# Patient Record
Sex: Female | Born: 1985 | Race: Black or African American | Hispanic: No | Marital: Single | Smoking: Never smoker
Health system: Southern US, Community
[De-identification: ages and names within clinical notes are randomized; demographics above are authoritative.]

## PROBLEM LIST (undated history)

## (undated) ENCOUNTER — Inpatient Hospital Stay (HOSPITAL_COMMUNITY): Payer: Self-pay

## (undated) DIAGNOSIS — Z8619 Personal history of other infectious and parasitic diseases: Secondary | ICD-10-CM

## (undated) DIAGNOSIS — D573 Sickle-cell trait: Secondary | ICD-10-CM

## (undated) HISTORY — DX: Sickle-cell trait: D57.3

## (undated) HISTORY — PX: THERAPEUTIC ABORTION: SHX798

## (undated) HISTORY — DX: Personal history of other infectious and parasitic diseases: Z86.19

## (undated) HISTORY — PX: DILATION AND CURETTAGE OF UTERUS: SHX78

## (undated) HISTORY — PX: WISDOM TOOTH EXTRACTION: SHX21

---

## 2012-01-01 ENCOUNTER — Inpatient Hospital Stay (HOSPITAL_COMMUNITY)
Admission: AD | Admit: 2012-01-01 | Discharge: 2012-01-01 | Disposition: A | Discharge status: Home / Self Care | Payer: 59 | Source: Ambulatory Visit | Attending: Obstetrics and Gynecology | Admitting: Obstetrics and Gynecology

## 2012-01-01 ENCOUNTER — Encounter (HOSPITAL_COMMUNITY): Payer: Self-pay | Admitting: *Deleted

## 2012-01-01 DIAGNOSIS — Z3201 Encounter for pregnancy test, result positive: Secondary | ICD-10-CM | POA: Insufficient documentation

## 2012-01-01 LAB — POCT PREGNANCY, URINE: Preg Test, Ur: POSITIVE — AB

## 2012-01-01 LAB — URINALYSIS, ROUTINE W REFLEX MICROSCOPIC
Leukocytes, UA: NEGATIVE
Nitrite: NEGATIVE
Specific Gravity, Urine: 1.01 (ref 1.005–1.030)
Urobilinogen, UA: 0.2 mg/dL (ref 0.0–1.0)

## 2012-01-01 LAB — URINE MICROSCOPIC-ADD ON

## 2012-01-01 NOTE — MAU Note (Signed)
PT  SAYS HAD NL IN FEB.  NO  CYCLE IN [facility]- April.    HOME PREG  LAST WEEK - POSTIVE.  SAYS  VOMITING STARTED LAST WEEK.   TODAY- VOMITED  X3.Marland Kitchen  FEELS ABD CRAMPS LIKE CYCLE - BUT NO PAIN.

## 2012-01-01 NOTE — MAU Provider Note (Signed)
History     CSN: [id number]  Arrival date & time 01/01/12  2149   None     No chief complaint on file.  HPI Maria Fuentes is a 26 y.o. female @ [redacted]w[redacted]d gestation who presents to MAU for pregnancy verification letter. Patient denies pain, bleeding or other problems.  Past Medical History  Diagnosis Date  . No pertinent past medical history     Past Surgical History  Procedure Date  . Dilation and curettage of uterus   . Wisdom tooth extraction     Family History  Problem Relation Age of Onset  . Diabetes Maternal Grandmother   . Hypertension Maternal Grandmother     History  Substance Use Topics  . Smoking status: Never Smoker   . Smokeless tobacco: Not on file  . Alcohol Use: No    OB History    Grav Para Term Preterm Abortions TAB SAB Ect Mult Living   2    1 1     0      Review of Systems  Gastrointestinal: Negative for nausea, vomiting and abdominal pain.  Genitourinary: Negative for dysuria, frequency, vaginal bleeding, vaginal discharge and pelvic pain.    Allergies  Review of patient's allergies indicates not on file.  Home Medications  No current outpatient prescriptions on file.  BP 112/68  Pulse 100  Temp(Src) 100.5 F (38.1 C) (Oral)  Resp 20  Ht 5\' 3"  (1.6 m)  Wt 230 lb (104.327 kg)  BMI 40.74 kg/m2  LMP 10/26/2011  Physical Exam  Constitutional: She is oriented to person, place, and time. She appears well-developed and well-nourished. No distress.  HENT:  Head: Normocephalic.  Eyes: EOM are normal.  Neck: Neck supple.  Cardiovascular: Normal rate.   Pulmonary/Chest: Effort normal.  Musculoskeletal: Normal range of motion.  Neurological: She is alert and oriented to person, place, and time.  Psychiatric: Her behavior is normal. Thought content normal.   Assessment: Positive pregnancy test  Plan:  Pregnancy verification letter   Instructions on health department and what she needs to take for insurance   Return as needed for  problems. ED Course  Procedures  MDM

## 2012-01-01 NOTE — Discharge Instructions (Signed)
  ________________________________________     To schedule your Maternity Eligibility Appointment, please call [phone].  When you arrive for your appointment you must bring the following items or information listed below.  Your appointment will be rescheduled if you do not have these items or are 15 minutes late. If currently receiving Medicaid, you MUST bring: 1. Medicaid Card 2. Social Security Card 3. Picture ID 4. Proof of Pregnancy 5. Verification of current address if the address on Medicaid card is incorrect "postmarked mail" If not receiving Medicaid, you MUST bring: 1. Social Security Card 2. Picture ID 3. Birth Certificate (if available) Passport or *Green Card 4. Proof of Pregnancy 5. Verification of current address "postmarked mail" for each income presented. 6. Verification of insurance coverage, if any 7. Check stubs from each employer for the previous month (if unable to present check stub  for each week, we will accept check stub for the first and last week ill the same month.) If you can't locate check stubs, you must bring a letter from the employer(s) and it must have the following information on letterhead, typed, in English: o name of company o company telephone number o how long been with the company, if less than one month o how much person earns per hour o how many hours per week work o the gross pay the person earned for the previous month If you are 28 years old or less, you do not have to bring proof of income unless you work or live with the father of the baby and at that time we will need proof of income from you and/or the father of the baby. Green Card recipients are eligible for Medicaid for Pregnant Women (MPW)    Prenatal Care  WHAT IS PRENATAL CARE?  Prenatal care means health care during your pregnancy, before your baby is born. Take care of yourself and your baby by:   Getting early prenatal care. If you know you are pregnant, or think  you might be pregnant, call your caregiver as soon as possible. Schedule a visit for a general/prenatal examination.   Getting regular prenatal care. Follow your caregiver's schedule for blood and other necessary tests. Do not miss appointments.   Do everything you can to keep yourself and your baby healthy during your pregnancy.   Prenatal care should include evaluation of medical, dietary, educational, psychological, and social needs for the couple and the medical, surgical, and genetic history of the family of the mother and father.   Discuss with your caregiver:   Your medicines, prescription, over-the-counter, and herbal medicines.   Substance abuse, alcohol, smoking, and illegal drugs.   Domestic abuse and violence, if present.   Your immunizations.   Nutrition and diet.   Exercising.   Environment and occupational hazards, at home and at work.   History of sexually transmitted disease, both you and your partner.   Previous pregnancies.  WHY IS PRENATAL CARE SO IMPORTANT?  By seeing you regularly, your caregiver has the chance to find problems early, so that they can be treated as soon as possible. Other problems might be prevented. Many studies have shown that early and regular prenatal care is important for the health of both mothers and their babies.  I AM THINKING ABOUT GETTING PREGNANT. HOW CAN I TAKE CARE OF MYSELF?  Taking care of yourself before you get pregnant helps you to have a healthy pregnancy. It also lowers your chances of having a baby born with a birth   defect. Here are ways to take care of yourself before you get pregnant:   Eat healthy foods, exercise regularly (30 minutes per day for most days of the week is best), and get enough rest and sleep. Talk to your caregiver about what kinds of foods and exercises are best for you.   Take 400 micrograms (mcg) of folic acid (one of the B vitamins) every day. The best way to do this is to take a daily multivitamin  pill that contains this amount of folic acid. Getting enough of the synthetic (manufactured) form of folic acid every day before you get pregnant and during early pregnancy can help prevent certain birth defects. Many breakfast cereals and other grain products have folic acid added to them, but only certain cereals contain 400 mcg of folic acid per serving. Check the label on your multivitamin or cereal to find the amount of folic acid in the food.   See your caregiver for a complete check up before getting pregnant. Make sure that you have had all your immunization shots, especially for rubella (German measles). Rubella can cause serious birth defects. Chickenpox is another illness you want to avoid during pregnancy. If you have had chickenpox and rubella in the past, you should be immune to them.   Tell your caregiver about any prescription or non-prescription medicines (including herbal remedies) you are taking. Some medicines are not safe to take during pregnancy.   Stop smoking cigarettes, drinking alcohol, or taking illegal drugs. Ask your caregiver for help, if you need it. You can also get help with alcohol and drugs by talking with a member of your faith community, a counselor, or a trusted friend.   Discuss and treat any medical, social, or psychological problems before getting pregnant.   Discuss any history of genetic problems in the mother, father, and their families. Do genetic testing before getting pregnant, when possible.   Discuss any physical or emotional abuse with your caregiver.   Discuss with your caregiver if you might be exposed to harmful chemicals on your job or where you live.   Discuss with your caregiver if you think your job or the hours you work may be harmful and should be changed.   The father should be involved with the decision making and with all aspects of the pregnancy, labor, and delivery.   If you have medical insurance, make sure you are covered for  pregnancy.  I JUST FOUND OUT THAT I AM PREGNANT. HOW CAN I TAKE CARE OF MYSELF?  Here are ways to take care of yourself and the precious new life growing inside you:   Continue taking your multivitamin with 400 micrograms (mcg) of folic acid every day.   Get early and regular prenatal care. It does not matter if this is your first pregnancy or if you already have children. It is very important to see a caregiver during your pregnancy. Your caregiver will check at each visit to make sure that you and the baby are healthy. If there are any problems, action can be taken right away to help you and the baby.   Eat a healthy diet that includes:   Fruits.   Vegetables.   Foods low in saturated fat.   Grains.   Calcium-rich foods.   Drink 6 to 8 glasses of liquids a day.   Unless your caregiver tells you not to, try to be physically active for 30 minutes, most days of the week. If you are pressed for   time, you can get your activity in through 10 minute segments, three times a day.   If you smoke, drink alcohol, or use drugs, STOP. These can cause long-term damage to your baby. Talk with your caregiver about steps to take to stop smoking. Talk with a member of your faith community, a counselor, a trusted friend, or your caregiver if you are concerned about your alcohol or drug use.   Ask your caregiver before taking any medicine, even over-the-counter medicines. Some medicines are not safe to take during pregnancy.   Get plenty of rest and sleep.   Avoid hot tubs and saunas during pregnancy.   Do not have X-rays taken, unless absolutely necessary and with the recommendation of your caregiver. A lead shield can be placed on your abdomen, to protect the baby when X-rays are taken in other parts of the body.   Do not empty the cat litter when you are pregnant. It may contain a parasite that causes an infection called toxoplasmosis, which can cause birth defects. Also, use gloves when working in  garden areas used by cats.   Do not eat uncooked or undercooked cheese, meats, or fish.   Stay away from toxic chemicals like:   Insecticides.   Solvents (some cleaners or paint thinners).   Lead.   Mercury.   Sexual relations may continue until the end of the pregnancy, unless you have a medical problem or there is a problem with the pregnancy and your caregiver tells you not to.   Do not wear high heel shoes, especially during the second half of the pregnancy. You can lose your balance and fall.   Do not take long trips, unless absolutely necessary. Be sure to see your caregiver before going on the trip.   Do not sit in one position for more than 2 hours, when on a trip.   Take a copy of your medical records when going on a trip.   Know where there is a hospital in the city you are visiting, in case of an emergency.   Most dangerous household products will have pregnancy warnings on their labels. Ask your caregiver about products if you are unsure.   Limit or eliminate your caffeine intake from coffee, tea, sodas, medicines, and chocolate.   Many women continue working through pregnancy. Staying active might help you stay healthier. If you have a question about the safety or the hours you work at your particular job, talk with your caregiver.   Get informed:   Read books.   Watch videos.   Go to childbirth classes for you and the father.   Talk with experienced moms.   Ask your caregiver about childbirth education classes for you and your partner. Classes can help you and your partner prepare for the birth of your baby.   Ask about a pediatrician (baby doctor) and methods and pain medicine for labor, delivery, and possible Cesarean delivery (C-section).  I AM NOT THINKING ABOUT GETTING PREGNANT RIGHT NOW, BUT HEARD THAT ALL WOMEN SHOULD TAKE FOLIC ACID EVERY DAY?  All women of childbearing age, with even a remote chance of getting pregnant, should try to make sure they  get enough folic acid. Many pregnancies are not planned. Many women do not know they are actually pregnant early in their pregnancies, and certain birth defects happen in the very early part of pregnancy. Taking 400 micrograms (mcg) of folic acid every day will help prevent certain birth defects that happen in the early   part of pregnancy. If a woman begins taking vitamin pills in the second or third month of pregnancy, it may be too late to prevent birth defects. Folic acid may also have other health benefits for women, besides preventing birth defects.  HOW OFTEN SHOULD I SEE MY CAREGIVER DURING PREGNANCY?  Your caregiver will give you a schedule for your prenatal visits. You will have visits more often as you get closer to the end of your pregnancy. An average pregnancy lasts about 40 weeks.  A typical schedule includes visiting your caregiver:   About once each month, during your first 6 months of pregnancy.   Every 2 weeks, during the next 2 months.   Weekly in the last month, until the delivery date.  Your caregiver will probably want to see you more often if:  You are over 35.   Your pregnancy is high risk, because you have certain health problems or problems with the pregnancy, such as:   Diabetes.   High blood pressure.   The baby is not growing on schedule, according to the dates of the pregnancy.  Your caregiver will do special tests, to make sure you and the baby are not having any serious problems. WHAT HAPPENS DURING PRENATAL VISITS?   At your first prenatal visit, your caregiver will talk to you about you and your partner's health history and your family's health history, and will do a physical exam.   On your first visit, a physical exam will include checks of your blood pressure, height and weight, and an exam of your pelvic organs. Your caregiver will do a Pap test if you have not had one recently, and will do cultures of your cervix to make sure there is no infection.    At each visit, there will be tests of your blood, urine, blood pressure, weight, and checking the progress of the baby.   Your caregiver will be able to tell you when to expect that your baby will be born.   Each visit is also a chance for you to learn about staying healthy during pregnancy and for asking questions.   Discuss whether you will be breastfeeding.   At your later prenatal visits, your caregiver will check how you are doing and how the baby is developing. You may have a number of tests done as your pregnancy progresses.   Ultrasound exams are often used to check on the baby's growth and health.   You may have more urine and blood tests, as well as special tests, if needed. These may include amniocentesis (examine fluid in the pregnancy sac), stress tests (check how baby responds to contractions), biophysical profile (measures fetus well-being). Your caregiver will explain the tests and why they are necessary.  I AM IN MY LATE THIRTIES, AND I WANT TO HAVE A CHILD NOW. SHOULD I DO ANYTHING SPECIAL?  As you get older, there is more chance of having a medical problem (high blood pressure), pregnancy problem (preeclampsia, problems with the placenta), miscarriage, or a baby born with a birth defect. However, most women in their late thirties and early forties have healthy babies. See your caregiver on a regular basis before you get pregnant and be sure to go for exams throughout your pregnancy. Your caregiver probably will want to do some special tests to check on you and your baby's health when you are pregnant.  Women today are often delaying having children until later in life, when they are in their thirties and forties. While   many women in their thirties and forties have no difficulty getting pregnant, fertility does decline with age. For women over 40 who cannot get pregnant after 6 months of trying, it is recommended that they see their caregiver for a fertility evaluation. It is not  uncommon to have trouble becoming pregnant or experience infertility (inability to become pregnant after trying for one year). If you think that you or your partner may be infertile, you can discuss this with your caregiver. He or she can recommend treatments such as drugs, surgery, or assisted reproductive technology.  Document Released: 08/31/2003 Document Revised: 08/17/2011 Document Reviewed: 07/28/2009 [employer] Patient Information 2012 [employer]. 

## 2012-01-02 NOTE — MAU Provider Note (Signed)
Agree with above note.  Maria Fuentes 01/02/2012 6:51 AM

## 2012-04-30 LAB — OB RESULTS CONSOLE HEPATITIS B SURFACE ANTIGEN: Hepatitis B Surface Ag: NEGATIVE

## 2012-04-30 LAB — OB RESULTS CONSOLE ABO/RH: RH Type: POSITIVE

## 2012-04-30 LAB — OB RESULTS CONSOLE HIV ANTIBODY (ROUTINE TESTING): HIV: NONREACTIVE

## 2012-04-30 LAB — OB RESULTS CONSOLE RPR: RPR: NONREACTIVE

## 2012-08-13 ENCOUNTER — Telehealth (HOSPITAL_COMMUNITY): Payer: Self-pay | Admitting: *Deleted

## 2012-08-13 ENCOUNTER — Inpatient Hospital Stay (HOSPITAL_COMMUNITY): Payer: Self-pay | Admitting: *Deleted

## 2012-08-13 NOTE — Telephone Encounter (Signed)
Preadmission screen  

## 2012-08-15 ENCOUNTER — Inpatient Hospital Stay (HOSPITAL_COMMUNITY)
Admission: RE | Admit: 2012-08-15 | Discharge: 2012-08-19 | DRG: 766 | Disposition: A | Discharge status: Home / Self Care | Payer: 59 | Source: Ambulatory Visit | Attending: Obstetrics and Gynecology | Admitting: Obstetrics and Gynecology

## 2012-08-15 ENCOUNTER — Encounter (HOSPITAL_COMMUNITY): Payer: Self-pay

## 2012-08-15 ENCOUNTER — Inpatient Hospital Stay (HOSPITAL_COMMUNITY)
Admission: AD | Admit: 2012-08-15 | Discharge: 2012-08-15 | Disposition: A | Discharge status: Home / Self Care | Payer: Medicaid Other | Source: Ambulatory Visit | Attending: Obstetrics and Gynecology | Admitting: Obstetrics and Gynecology

## 2012-08-15 DIAGNOSIS — O480 Post-term pregnancy: Principal | ICD-10-CM | POA: Diagnosis present

## 2012-08-15 DIAGNOSIS — Z22330 Carrier of Group B streptococcus: Secondary | ICD-10-CM

## 2012-08-15 DIAGNOSIS — D573 Sickle-cell trait: Secondary | ICD-10-CM | POA: Diagnosis present

## 2012-08-15 DIAGNOSIS — O3660X0 Maternal care for excessive fetal growth, unspecified trimester, not applicable or unspecified: Secondary | ICD-10-CM | POA: Diagnosis present

## 2012-08-15 DIAGNOSIS — O99892 Other specified diseases and conditions complicating childbirth: Secondary | ICD-10-CM | POA: Diagnosis present

## 2012-08-15 DIAGNOSIS — O9902 Anemia complicating childbirth: Secondary | ICD-10-CM | POA: Diagnosis present

## 2012-08-15 DIAGNOSIS — O479 False labor, unspecified: Secondary | ICD-10-CM | POA: Insufficient documentation

## 2012-08-15 DIAGNOSIS — O471 False labor at or after 37 completed weeks of gestation: Secondary | ICD-10-CM

## 2012-08-15 LAB — CBC
Hemoglobin: 12.1 g/dL (ref 12.0–15.0)
RBC: 4.3 MIL/uL (ref 3.87–5.11)

## 2012-08-15 LAB — TYPE AND SCREEN: ABO/RH(D): A POS

## 2012-08-15 MED ORDER — EPHEDRINE 5 MG/ML INJ: 10.0000 mg | INTRAVENOUS | Status: DC | PRN

## 2012-08-15 MED ORDER — PHENYLEPHRINE 40 MCG/ML (10ML) SYRINGE FOR IV PUSH (FOR BLOOD PRESSURE SUPPORT): 80.0000 ug | PREFILLED_SYRINGE | INTRAVENOUS | Status: DC | PRN

## 2012-08-15 MED ORDER — LACTATED RINGERS IV SOLN
INTRAVENOUS | Status: DC
  Administered 2012-08-15 – 2012-08-16 (×3): via INTRAVENOUS

## 2012-08-15 MED ORDER — MORPHINE SULFATE 4 MG/ML IJ SOLN
8.0000 mg | Freq: Once | INTRAMUSCULAR | Status: AC
  Administered 2012-08-15: 8 mg via INTRAMUSCULAR
  Filled 2012-08-15: qty 2

## 2012-08-15 MED ORDER — OXYCODONE-ACETAMINOPHEN 5-325 MG PO TABS: 1.0000 | ORAL_TABLET | ORAL | Status: DC | PRN

## 2012-08-15 MED ORDER — EPHEDRINE 5 MG/ML INJ
10.0000 mg | INTRAVENOUS | Status: DC | PRN
  Filled 2012-08-15: qty 4

## 2012-08-15 MED ORDER — IBUPROFEN 600 MG PO TABS: 600.0000 mg | ORAL_TABLET | Freq: Four times a day (QID) | ORAL | Status: DC | PRN

## 2012-08-15 MED ORDER — NALBUPHINE SYRINGE 5 MG/0.5 ML
5.0000 mg | INJECTION | INTRAMUSCULAR | Status: DC | PRN
  Administered 2012-08-15 – 2012-08-16 (×2): 5 mg via INTRAVENOUS
  Filled 2012-08-15 (×2): qty 0.5

## 2012-08-15 MED ORDER — CITRIC ACID-SODIUM CITRATE 334-500 MG/5ML PO SOLN
30.0000 mL | ORAL | Status: DC | PRN
  Administered 2012-08-16: 30 mL via ORAL
  Filled 2012-08-15: qty 15

## 2012-08-15 MED ORDER — LACTATED RINGERS IV SOLN
500.0000 mL | INTRAVENOUS | Status: DC | PRN
  Administered 2012-08-16 (×2): 1000 mL via INTRAVENOUS

## 2012-08-15 MED ORDER — TERBUTALINE SULFATE 1 MG/ML IJ SOLN: 0.2500 mg | Freq: Once | INTRAMUSCULAR | Status: AC | PRN

## 2012-08-15 MED ORDER — PENICILLIN G POTASSIUM 5000000 UNITS IJ SOLR
5.0000 10*6.[IU] | Freq: Once | INTRAVENOUS | Status: AC
  Administered 2012-08-15: 5 10*6.[IU] via INTRAVENOUS
  Filled 2012-08-15: qty 5

## 2012-08-15 MED ORDER — FLEET ENEMA 7-19 GM/118ML RE ENEM: 1.0000 | ENEMA | RECTAL | Status: DC | PRN

## 2012-08-15 MED ORDER — PHENYLEPHRINE 40 MCG/ML (10ML) SYRINGE FOR IV PUSH (FOR BLOOD PRESSURE SUPPORT)
80.0000 ug | PREFILLED_SYRINGE | INTRAVENOUS | Status: DC | PRN
  Administered 2012-08-16: 80 ug via INTRAVENOUS
  Filled 2012-08-15: qty 5

## 2012-08-15 MED ORDER — OXYTOCIN 40 UNITS IN LACTATED RINGERS INFUSION - SIMPLE MED: 62.5000 mL/h | INTRAVENOUS | Status: DC

## 2012-08-15 MED ORDER — ZOLPIDEM TARTRATE 5 MG PO TABS: 5.0000 mg | ORAL_TABLET | Freq: Every evening | ORAL | Status: DC | PRN

## 2012-08-15 MED ORDER — ACETAMINOPHEN 325 MG PO TABS: 650.0000 mg | ORAL_TABLET | ORAL | Status: DC | PRN

## 2012-08-15 MED ORDER — FENTANYL 2.5 MCG/ML BUPIVACAINE 1/10 % EPIDURAL INFUSION (WH - ANES)
14.0000 mL/h | INTRAMUSCULAR | Status: DC
  Administered 2012-08-16: 14 mL/h via EPIDURAL
  Filled 2012-08-15: qty 125

## 2012-08-15 MED ORDER — OXYTOCIN BOLUS FROM INFUSION: 500.0000 mL | INTRAVENOUS | Status: DC

## 2012-08-15 MED ORDER — OXYTOCIN 40 UNITS IN LACTATED RINGERS INFUSION - SIMPLE MED
1.0000 m[IU]/min | INTRAVENOUS | Status: DC
  Administered 2012-08-15: 2 m[IU]/min via INTRAVENOUS
  Filled 2012-08-15: qty 1000

## 2012-08-15 MED ORDER — ONDANSETRON HCL 4 MG/2ML IJ SOLN: 4.0000 mg | Freq: Four times a day (QID) | INTRAMUSCULAR | Status: DC | PRN

## 2012-08-15 MED ORDER — PENICILLIN G POTASSIUM 5000000 UNITS IJ SOLR
2.5000 10*6.[IU] | INTRAVENOUS | Status: DC
  Administered 2012-08-16 (×4): 2.5 10*6.[IU] via INTRAVENOUS
  Filled 2012-08-15 (×7): qty 2.5

## 2012-08-15 MED ORDER — LIDOCAINE HCL (PF) 1 % IJ SOLN: 30.0000 mL | INTRAMUSCULAR | Status: DC | PRN

## 2012-08-15 MED ORDER — LACTATED RINGERS IV SOLN
500.0000 mL | Freq: Once | INTRAVENOUS | Status: AC
  Administered 2012-08-16: 500 mL via INTRAVENOUS

## 2012-08-15 MED ORDER — DIPHENHYDRAMINE HCL 50 MG/ML IJ SOLN: 12.5000 mg | INTRAMUSCULAR | Status: DC | PRN

## 2012-08-15 NOTE — MAU Note (Signed)
Pt states contractions started at 9pm and have progressively gotten closer together and stronger

## 2012-08-15 NOTE — MAU Provider Note (Signed)
Pt was reviewed and I agree with management plan

## 2012-08-15 NOTE — MAU Provider Note (Signed)
  History     CSN: [id number]  Arrival date and time: 08/15/12 7829   None     Chief Complaint  Patient presents with  . Labor Eval   HPI Maria Fuentes is 26yo G1P0 with GA [redacted]wk who presents with contractions. Patient reports contractions since 9pm yesterday and it is getting worse. She denies vaginal discharge, rupture of membranes or vaginal bleeding.  Good fetal movement. Pt has an induction schedule for tomorrow morning.  Past Medical History  Diagnosis Date  . No pertinent past medical history   . H/O varicella   . Sickle cell trait     Past Surgical History  Procedure Date  . Dilation and curettage of uterus   . Wisdom tooth extraction     Family History  Problem Relation Age of Onset  . Diabetes Maternal Grandmother   . Hypertension Maternal Grandmother   . Sickle cell trait Mother     History  Substance Use Topics  . Smoking status: Never Smoker   . Smokeless tobacco: Not on file  . Alcohol Use: No    Allergies: No Known Allergies  Prescriptions prior to admission  Medication Sig Dispense Refill  . Ascorbic Acid (VITAMIN C) 100 MG tablet Take 100 mg by mouth daily.      . fish oil-omega-3 fatty acids 1000 MG capsule Take 1 g by mouth daily.        Review of Systems  Constitutional: Negative for fever.  Eyes: Negative for blurred vision and double vision.  Gastrointestinal: Negative for nausea and vomiting.  Genitourinary: Negative for dysuria.  Neurological: Negative for headaches.   Physical Exam   Last menstrual period 10/26/2011.  Physical Exam  Constitutional: She appears well-developed.  HENT:  Head: Normocephalic.  Neck: Normal range of motion.  Cardiovascular: Normal rate, regular rhythm and normal heart sounds.   Respiratory: Effort normal.  Cervical 1/80/-2 FHT:  Baseline 140s , moderate variability, accelerations present, no decelerations Category I Contractions: Q5-6 mins, moderate, regular     MAU Course   Procedures  MDM Monitoring Pain meds Discharge home  Assessment and Plan  Maria Fuentes is 26yo G1P0 with GA [redacted]wk who presents with contractions - Early stage labor - FHT: Category I - Contractions regular Q6-77min.  - Morphine for pain  - patient is stable to be discharge.  - Return if symptoms worsening. Ginger Organ E 08/15/2012, 3:11 AM

## 2012-08-15 NOTE — H&P (Signed)
26 y.o. [redacted]w[redacted]d  G1P0000 comes in for induction postdates.  Otherwise has good fetal movement and no bleeding.  Past Medical History  Diagnosis Date  . No pertinent past medical history   . H/O varicella   . Sickle cell trait     Past Surgical History  Procedure Date  . Dilation and curettage of uterus   . Wisdom tooth extraction     OB History    Grav Para Term Preterm Abortions TAB SAB Ect Mult Living   1    0 0    0     # Outc Date GA Lbr Len/2nd Wgt Sex Del Anes PTL Lv   1 CUR               History   Social History  . Marital Status: Single    Spouse Name: N/A    Number of Children: N/A  . Years of Education: N/A   Occupational History  . Not on file.   Social History Main Topics  . Smoking status: Never Smoker   . Smokeless tobacco: Not on file  . Alcohol Use: No  . Drug Use: No  . Sexually Active: Yes    Birth Control/ Protection: None   Other Topics Concern  . Not on file   Social History Narrative  . No narrative on file   Review of patient's allergies indicates no known allergies.    Prenatal Transfer Tool  Maternal Diabetes: No Genetic Screening: Normal Maternal Ultrasounds/Referrals: Normal Fetal Ultrasounds or other Referrals:  None Maternal Substance Abuse:  No Significant Maternal Medications:  None Significant Maternal Lab Results: None  Other PNC:Pt passed 3 hr gtt.  FOB is sickle cell neg.    Filed Vitals:   08/15/12 1956  BP: 115/63  Pulse: 112  Temp: 98.2 F (36.8 C)  Resp: 20     Lungs/Cor:  NAD Abdomen:  soft, gravid Ex:  no cords, erythema SVE:  1/80/-2 per nurse FHTs:  130s, good STV, NST R Toco:  q5   A/P   Postdates induction. Start pitocin.  GBS pos- PCN.  Maria Fuentes A

## 2012-08-16 ENCOUNTER — Encounter (HOSPITAL_COMMUNITY): Payer: Self-pay

## 2012-08-16 ENCOUNTER — Encounter (HOSPITAL_COMMUNITY)
Admission: RE | Disposition: A | Discharge status: Home / Self Care | Payer: Self-pay | Source: Ambulatory Visit | Attending: Obstetrics and Gynecology

## 2012-08-16 ENCOUNTER — Encounter (HOSPITAL_COMMUNITY): Payer: Self-pay | Admitting: Anesthesiology

## 2012-08-16 ENCOUNTER — Inpatient Hospital Stay (HOSPITAL_COMMUNITY): Payer: 59 | Admitting: Anesthesiology

## 2012-08-16 LAB — ABO/RH: ABO/RH(D): A POS

## 2012-08-16 LAB — RPR: RPR Ser Ql: NONREACTIVE

## 2012-08-16 SURGERY — Surgical Case
Anesthesia: Epidural | Site: Abdomen | Wound class: Clean Contaminated

## 2012-08-16 MED ORDER — WITCH HAZEL-GLYCERIN EX PADS: 1.0000 "application " | MEDICATED_PAD | CUTANEOUS | Status: DC | PRN

## 2012-08-16 MED ORDER — SIMETHICONE 80 MG PO CHEW: 80.0000 mg | CHEWABLE_TABLET | ORAL | Status: DC | PRN

## 2012-08-16 MED ORDER — METOCLOPRAMIDE HCL 5 MG/ML IJ SOLN: 10.0000 mg | Freq: Three times a day (TID) | INTRAMUSCULAR | Status: DC | PRN

## 2012-08-16 MED ORDER — SODIUM BICARBONATE 8.4 % IV SOLN
INTRAVENOUS | Status: DC | PRN
  Administered 2012-08-16: 5 mL via EPIDURAL

## 2012-08-16 MED ORDER — KETOROLAC TROMETHAMINE 60 MG/2ML IM SOLN
60.0000 mg | Freq: Once | INTRAMUSCULAR | Status: AC | PRN
  Administered 2012-08-16: 60 mg via INTRAMUSCULAR

## 2012-08-16 MED ORDER — KETOROLAC TROMETHAMINE 60 MG/2ML IM SOLN
INTRAMUSCULAR | Status: AC
  Filled 2012-08-16: qty 2

## 2012-08-16 MED ORDER — DIPHENHYDRAMINE HCL 50 MG/ML IJ SOLN: 12.5000 mg | INTRAMUSCULAR | Status: DC | PRN

## 2012-08-16 MED ORDER — ONDANSETRON HCL 4 MG/2ML IJ SOLN
INTRAMUSCULAR | Status: DC | PRN
  Administered 2012-08-16: 4 mg via INTRAVENOUS

## 2012-08-16 MED ORDER — DIPHENHYDRAMINE HCL 25 MG PO CAPS
25.0000 mg | ORAL_CAPSULE | ORAL | Status: DC | PRN
  Filled 2012-08-16: qty 1

## 2012-08-16 MED ORDER — OXYTOCIN 40 UNITS IN LACTATED RINGERS INFUSION - SIMPLE MED: 62.5000 mL/h | INTRAVENOUS | Status: AC

## 2012-08-16 MED ORDER — SODIUM CHLORIDE 0.9 % IJ SOLN: 3.0000 mL | INTRAMUSCULAR | Status: DC | PRN

## 2012-08-16 MED ORDER — MEPERIDINE HCL 25 MG/ML IJ SOLN: 6.2500 mg | INTRAMUSCULAR | Status: DC | PRN

## 2012-08-16 MED ORDER — SIMETHICONE 80 MG PO CHEW
80.0000 mg | CHEWABLE_TABLET | Freq: Three times a day (TID) | ORAL | Status: DC
  Administered 2012-08-17 – 2012-08-19 (×9): 80 mg via ORAL

## 2012-08-16 MED ORDER — ONDANSETRON HCL 4 MG/2ML IJ SOLN: 4.0000 mg | INTRAMUSCULAR | Status: DC | PRN

## 2012-08-16 MED ORDER — MORPHINE SULFATE (PF) 0.5 MG/ML IJ SOLN
INTRAMUSCULAR | Status: DC | PRN
  Administered 2012-08-16: 1 mg via INTRAVENOUS

## 2012-08-16 MED ORDER — METHYLERGONOVINE MALEATE 0.2 MG PO TABS: 0.2000 mg | ORAL_TABLET | ORAL | Status: DC | PRN

## 2012-08-16 MED ORDER — OXYTOCIN 10 UNIT/ML IJ SOLN
INTRAMUSCULAR | Status: AC
  Filled 2012-08-16: qty 4

## 2012-08-16 MED ORDER — TETANUS-DIPHTH-ACELL PERTUSSIS 5-2.5-18.5 LF-MCG/0.5 IM SUSP: 0.5000 mL | Freq: Once | INTRAMUSCULAR | Status: DC

## 2012-08-16 MED ORDER — MORPHINE SULFATE 0.5 MG/ML IJ SOLN
INTRAMUSCULAR | Status: AC
  Filled 2012-08-16: qty 10

## 2012-08-16 MED ORDER — BUTORPHANOL TARTRATE 2 MG/ML IJ SOLN
2.0000 mg | INTRAMUSCULAR | Status: DC | PRN
  Administered 2012-08-16 (×2): 2 mg via INTRAVENOUS
  Filled 2012-08-16 (×2): qty 2

## 2012-08-16 MED ORDER — IBUPROFEN 600 MG PO TABS
600.0000 mg | ORAL_TABLET | Freq: Four times a day (QID) | ORAL | Status: DC
  Administered 2012-08-17 – 2012-08-19 (×9): 600 mg via ORAL
  Filled 2012-08-16 (×9): qty 1

## 2012-08-16 MED ORDER — NALOXONE HCL 1 MG/ML IJ SOLN: 1.0000 ug/kg/h | INTRAVENOUS | Status: DC | PRN

## 2012-08-16 MED ORDER — SCOPOLAMINE 1 MG/3DAYS TD PT72
MEDICATED_PATCH | TRANSDERMAL | Status: AC
  Filled 2012-08-16: qty 1

## 2012-08-16 MED ORDER — DIPHENHYDRAMINE HCL 25 MG PO CAPS: 25.0000 mg | ORAL_CAPSULE | Freq: Four times a day (QID) | ORAL | Status: DC | PRN

## 2012-08-16 MED ORDER — PROMETHAZINE HCL 25 MG/ML IJ SOLN
25.0000 mg | Freq: Four times a day (QID) | INTRAMUSCULAR | Status: DC | PRN
  Administered 2012-08-16: 25 mg via INTRAVENOUS
  Filled 2012-08-16: qty 1

## 2012-08-16 MED ORDER — LIDOCAINE HCL (PF) 1 % IJ SOLN
INTRAMUSCULAR | Status: DC | PRN
  Administered 2012-08-16 (×3): 4 mL

## 2012-08-16 MED ORDER — ONDANSETRON HCL 4 MG/2ML IJ SOLN: 4.0000 mg | Freq: Three times a day (TID) | INTRAMUSCULAR | Status: DC | PRN

## 2012-08-16 MED ORDER — DIBUCAINE 1 % RE OINT: 1.0000 "application " | TOPICAL_OINTMENT | RECTAL | Status: DC | PRN

## 2012-08-16 MED ORDER — OXYCODONE-ACETAMINOPHEN 5-325 MG PO TABS
1.0000 | ORAL_TABLET | ORAL | Status: DC | PRN
  Administered 2012-08-19: 1 via ORAL
  Filled 2012-08-16: qty 1

## 2012-08-16 MED ORDER — FENTANYL CITRATE 0.05 MG/ML IJ SOLN: 25.0000 ug | INTRAMUSCULAR | Status: DC | PRN

## 2012-08-16 MED ORDER — IBUPROFEN 600 MG PO TABS: 600.0000 mg | ORAL_TABLET | Freq: Four times a day (QID) | ORAL | Status: DC | PRN

## 2012-08-16 MED ORDER — LACTATED RINGERS IV SOLN
INTRAVENOUS | Status: DC | PRN
  Administered 2012-08-16 (×3): via INTRAVENOUS

## 2012-08-16 MED ORDER — KETOROLAC TROMETHAMINE 30 MG/ML IJ SOLN: 30.0000 mg | Freq: Four times a day (QID) | INTRAMUSCULAR | Status: DC | PRN

## 2012-08-16 MED ORDER — SENNOSIDES-DOCUSATE SODIUM 8.6-50 MG PO TABS
2.0000 | ORAL_TABLET | Freq: Every day | ORAL | Status: DC
  Administered 2012-08-17 – 2012-08-18 (×2): 2 via ORAL

## 2012-08-16 MED ORDER — ZOLPIDEM TARTRATE 5 MG PO TABS: 5.0000 mg | ORAL_TABLET | Freq: Every evening | ORAL | Status: DC | PRN

## 2012-08-16 MED ORDER — SODIUM BICARBONATE 8.4 % IV SOLN
INTRAVENOUS | Status: AC
  Filled 2012-08-16: qty 50

## 2012-08-16 MED ORDER — DIPHENHYDRAMINE HCL 50 MG/ML IJ SOLN: 25.0000 mg | INTRAMUSCULAR | Status: DC | PRN

## 2012-08-16 MED ORDER — NALBUPHINE HCL 10 MG/ML IJ SOLN: 5.0000 mg | INTRAMUSCULAR | Status: DC | PRN

## 2012-08-16 MED ORDER — CEFAZOLIN SODIUM 10 G IJ SOLR
3.0000 g | INTRAMUSCULAR | Status: AC
  Administered 2012-08-16: 3 g via INTRAVENOUS
  Filled 2012-08-16: qty 3000

## 2012-08-16 MED ORDER — LIDOCAINE-EPINEPHRINE (PF) 2 %-1:200000 IJ SOLN
INTRAMUSCULAR | Status: AC
  Filled 2012-08-16: qty 20

## 2012-08-16 MED ORDER — MENTHOL 3 MG MT LOZG: 1.0000 | LOZENGE | OROMUCOSAL | Status: DC | PRN

## 2012-08-16 MED ORDER — ONDANSETRON HCL 4 MG/2ML IJ SOLN
INTRAMUSCULAR | Status: AC
Start: 2012-08-16 — End: 2012-08-16
  Filled 2012-08-16: qty 2

## 2012-08-16 MED ORDER — OXYTOCIN 10 UNIT/ML IJ SOLN
40.0000 [IU] | INTRAVENOUS | Status: DC | PRN
  Administered 2012-08-16: 40 [IU] via INTRAVENOUS

## 2012-08-16 MED ORDER — LACTATED RINGERS IV SOLN
INTRAVENOUS | Status: DC
  Administered 2012-08-17: 05:00:00 via INTRAVENOUS

## 2012-08-16 MED ORDER — LANOLIN HYDROUS EX OINT: 1.0000 "application " | TOPICAL_OINTMENT | CUTANEOUS | Status: DC | PRN

## 2012-08-16 MED ORDER — ONDANSETRON HCL 4 MG PO TABS: 4.0000 mg | ORAL_TABLET | ORAL | Status: DC | PRN

## 2012-08-16 MED ORDER — SCOPOLAMINE 1 MG/3DAYS TD PT72
1.0000 | MEDICATED_PATCH | Freq: Once | TRANSDERMAL | Status: DC
  Administered 2012-08-16: 1.5 mg via TRANSDERMAL

## 2012-08-16 MED ORDER — PRENATAL MULTIVITAMIN CH
1.0000 | ORAL_TABLET | Freq: Every day | ORAL | Status: DC
  Administered 2012-08-17 – 2012-08-18 (×2): 1 via ORAL
  Filled 2012-08-16 (×3): qty 1

## 2012-08-16 MED ORDER — NALOXONE HCL 0.4 MG/ML IJ SOLN: 0.4000 mg | INTRAMUSCULAR | Status: DC | PRN

## 2012-08-16 MED ORDER — METHYLERGONOVINE MALEATE 0.2 MG/ML IJ SOLN: 0.2000 mg | INTRAMUSCULAR | Status: DC | PRN

## 2012-08-16 MED ORDER — MORPHINE SULFATE (PF) 0.5 MG/ML IJ SOLN
INTRAMUSCULAR | Status: DC | PRN
  Administered 2012-08-16: 4 mg via EPIDURAL

## 2012-08-16 SURGICAL SUPPLY — 34 items
CLOTH BEACON ORANGE TIMEOUT ST (SAFETY) ×2 IMPLANT
DERMABOND ADVANCED (GAUZE/BANDAGES/DRESSINGS) ×1
DERMABOND ADVANCED .7 DNX12 (GAUZE/BANDAGES/DRESSINGS) ×1 IMPLANT
DRESSING TELFA 8X3 (GAUZE/BANDAGES/DRESSINGS) ×2 IMPLANT
DRSG COVADERM 4X10 (GAUZE/BANDAGES/DRESSINGS) ×2 IMPLANT
DRSG OPSITE POSTOP 4X10 (GAUZE/BANDAGES/DRESSINGS) IMPLANT
DURAPREP 26ML APPLICATOR (WOUND CARE) ×2 IMPLANT
ELECT REM PT RETURN 9FT ADLT (ELECTROSURGICAL) ×2
ELECTRODE REM PT RTRN 9FT ADLT (ELECTROSURGICAL) ×1 IMPLANT
EXTRACTOR VACUUM M CUP 4 TUBE (SUCTIONS) IMPLANT
GAUZE SPONGE 4X4 12PLY STRL LF (GAUZE/BANDAGES/DRESSINGS) ×4 IMPLANT
GLOVE BIO SURGEON STRL SZ7 (GLOVE) ×4 IMPLANT
GOWN PREVENTION PLUS LG XLONG (DISPOSABLE) ×4 IMPLANT
KIT ABG SYR 3ML LUER SLIP (SYRINGE) IMPLANT
NEEDLE HYPO 25X5/8 SAFETYGLIDE (NEEDLE) IMPLANT
NS IRRIG 1000ML POUR BTL (IV SOLUTION) ×2 IMPLANT
PACK C SECTION WH (CUSTOM PROCEDURE TRAY) ×2 IMPLANT
PAD ABD 7.5X8 STRL (GAUZE/BANDAGES/DRESSINGS) ×2 IMPLANT
PAD OB MATERNITY 4.3X12.25 (PERSONAL CARE ITEMS) IMPLANT
RETRACTOR WND ALEXIS 25 LRG (MISCELLANEOUS) ×1 IMPLANT
RTRCTR C-SECT PINK 25CM LRG (MISCELLANEOUS) ×2 IMPLANT
RTRCTR WOUND ALEXIS 25CM LRG (MISCELLANEOUS) ×2
SLEEVE SCD COMPRESS KNEE MED (MISCELLANEOUS) IMPLANT
STAPLER VISISTAT 35W (STAPLE) ×2 IMPLANT
SUT CHROMIC 1 CTX 36 (SUTURE) ×4 IMPLANT
SUT PDS AB 0 CTX 60 (SUTURE) ×2 IMPLANT
SUT PLAIN 2 0 XLH (SUTURE) ×2 IMPLANT
SUT VIC AB 2-0 CT1 27 (SUTURE) ×2
SUT VIC AB 2-0 CT1 TAPERPNT 27 (SUTURE) ×2 IMPLANT
SUT VIC AB 4-0 KS 27 (SUTURE) IMPLANT
TAPE CLOTH SURG 4X10 WHT LF (GAUZE/BANDAGES/DRESSINGS) ×2 IMPLANT
TOWEL OR 17X24 6PK STRL BLUE (TOWEL DISPOSABLE) ×4 IMPLANT
TRAY FOLEY CATH 14FR (SET/KITS/TRAYS/PACK) ×2 IMPLANT
WATER STERILE IRR 1000ML POUR (IV SOLUTION) ×2 IMPLANT

## 2012-08-16 NOTE — Progress Notes (Signed)
Patient ID: Maria Fuentes, female   DOB: September 09, 1986, 26 y.o.   MRN: [id number]  S: More comfortable after epidural  O: AFVSS FHT 150 reactive with accels no decels cvx 5/c/-2, very narrow pubic arch toco irregular  Pt had prolonged decel after epidural which improved with IVF, ephedrine, O2 and position changes. FHT now reassuring  IUPC placed due to lack of progression in labor  A/P 1) Restart pitocin 2) Pelvic exam concerning for narrow arch high vertex.

## 2012-08-16 NOTE — Progress Notes (Signed)
Patient ID: Maria Fuentes, female   DOB: [birth year], 26 y.o.   MRN: [id number]  S: Feeling pressure with contractions O: AFVSS Obese gravid cvx 5/80/-2, no with edema toco Q2-3  A/P 1) Pt has been 5 cm since early this morning.  Her cervix was completely effaced but over the last several hours she has developed edema of the cervix.  Given adequate labor by IUPC and cervical edema with proceed with cesarean for failure to progress, suspected macrosomia. R/B/A reviewed, will proceed.

## 2012-08-16 NOTE — Op Note (Signed)
Pre-Operative Diagnosis: 1) 41+1 week Intrauterine pregnancy 2) Failure to Progress 3) Suspected Macrosomia Postoperative Diagnosis: Same and Occiput Posterior Presentation Procedure: Primary Low Transverse Cesarean Section via Pfannenstiel Skin Incision Surgeon: Dr. Waynard Reeds Assistant: None Operative Findings: Vigorous female infant in the vertex, OP presentation with apgars of 9 & 9.  Weight pending.  Normal ovaries and tubes. A small 2-3 cm fibroid was noted on the anterior left of the uterus. Specimen: Placenta to L&D for disposal EBL:  ~650cc   Procedure:Maria Fuentes is an 26 year old gravida 1 para 0 at 78 weeks and 1 days estimated gestational age who presents for cesarean section. The patient was admitted for a post-term induction of labor.  She made it to 5 cm dilated with complete effacement. She remained 5 cm dilated for 8 hours with adequate labor and then began to develop cervical edema. At this point the decision was made to proceed with cesarean section for failure to progress. Following the appropriate informed consent the patient was brought to the operating room where epidural anesthesia was administered and found to be adequate. She was placed in the dorsal supine position with a leftward tilt. She was prepped and draped in the normal sterile fashion. Scalpel was then used to make a Pfannenstiel skin incision which was carried down to the underlying layers of soft tissue to the fascia. The fascia was incised in the midline and the fascial incision was extended laterally with Mayo scissors. The superior aspect of the fascial incision was grasped with Coker clamps x2, tented up and the rectus muscles dissected off sharply with the electrocautery unit area and the same procedure was repeated on the inferior aspect of the fascial incision. The rectus muscles were separated in the midline. The abdominal peritoneum was identified, tented up, entered sharply, and the incision was extended  superiorly and inferiorly with good visualization of the bladder. The Alexis retractor was then deployed. The vesicouterine peritoneum was identified, tented up, entered sharply, and the bladder flap was created digitally. Scalpel was then used to make a low transverse incision on the uterus which was extended laterally with both blunt dissection and the bandage scissors. The fetal vertex was identified, delivered easily through the uterine incision followed by the body. The infant was bulb suctioned on the operative field cried vigorously, cord was clamped and cut and the infant was passed to the waiting neonatologist. Placenta was then delivered spontaneously, the uterus was cleared of all clot and debris. The uterine incision was repaired with #1 chromic in running locked fashion followed by a second imbricating layer. Ovaries and tubes were inspected and normal. The Alexis retractor was removed. The uterus was returned to the abdominal cavity the abdominal cavity was cleared of all clot and debris. The abdominal peritoneum was reapproximated with 2-0 Vicryl in a running fashion, the rectus muscles was reapproximated with #1 chromic in a running fashion. The fascia was closed with a looped PDS in a running fashion. The skin was closed with staples and a pressure dressing. All sponge lap and needle counts were correct x2. Patient tolerated the procedure well and recovered in stable condition following the procedure.

## 2012-08-16 NOTE — Progress Notes (Signed)
Patient ID: Maria Fuentes, female   DOB: [birth year], 26 y.o.   MRN: [id number]  S: Uncomfortable, s/p stadol IV, declines epidural at this time O: Filed Vitals:   08/16/12 0736 08/16/12 0738 08/16/12 0804 08/16/12 0832  BP: 108/64 128/73 117/103 110/57  Pulse: 67 113 83 90  Temp: 98.6 F (37 C)     TempSrc: Oral     Resp: 20 20 20    Height:      Weight:       Sensorium altered due to stadol effect FHT 150 reassuring cvx 5/c/-1 toco Q4  AROM forebag clear fluid  A/P 1) Cont pit 2) FWB reassuring 3) Encourage epidural

## 2012-08-16 NOTE — Anesthesia Postprocedure Evaluation (Signed)
Anesthesia Post Note  Patient: Maria Fuentes  Procedure(s) Performed: Procedure(s) (LRB): CESAREAN SECTION (N/A)  Anesthesia type: Epidural  Patient location: PACU  Post pain: Pain level controlled  Post assessment: Post-op Vital signs reviewed  Last Vitals:  Filed Vitals:   08/16/12 1845  BP: 102/33  Pulse: 96  Temp:   Resp: 25    Post vital signs: stable  Level of consciousness: awake  Complications: No apparent anesthesia complications

## 2012-08-16 NOTE — Transfer of Care (Signed)
Immediate Anesthesia Transfer of Care Note  Patient: Maria Fuentes  Procedure(s) Performed: Procedure(s) (LRB) with comments: CESAREAN SECTION (N/A)  Patient Location: PACU  Anesthesia Type:Epidural  Level of Consciousness: awake  Airway & Oxygen Therapy: Patient Spontanous Breathing  Post-op Assessment: Report given to PACU RN and Post -op Vital signs reviewed and stable  Post vital signs: stable  Complications: No apparent anesthesia complications

## 2012-08-16 NOTE — Anesthesia Procedure Notes (Signed)
Epidural Patient location during procedure: OB Start time: 08/16/2012 11:25 AM  Staffing Performed by: anesthesiologist   Preanesthetic Checklist Completed: patient identified, site marked, surgical consent, pre-op evaluation, timeout performed, IV checked, risks and benefits discussed and monitors and equipment checked  Epidural Patient position: sitting Prep: site prepped and draped and DuraPrep Patient monitoring: continuous pulse ox and blood pressure Approach: midline Injection technique: LOR air  Needle:  Needle type: Tuohy  Needle gauge: 17 G Needle length: 9 cm and 9 Needle insertion depth: 7 cm Catheter type: closed end flexible Catheter size: 19 Gauge Catheter at skin depth: 12 cm Test dose: negative  Assessment Events: blood not aspirated, injection not painful, no injection resistance, negative IV test and no paresthesia  Additional Notes Discussed risk of headache, infection, bleeding, nerve injury and failed or incomplete block.  Patient voices understanding and wishes to proceed. Epidural placed on first attempt.  Patient tolerated procedure well.  No apparent complications.  Jasmine December, MDReason for block:procedure for pain

## 2012-08-16 NOTE — Anesthesia Preprocedure Evaluation (Signed)
Anesthesia Evaluation  Patient identified by MRN, date of birth, ID band Patient awake    Reviewed: Allergy & Precautions, H&P , NPO status , Patient's Chart, lab work & pertinent test results, reviewed documented beta blocker date and time   History of Anesthesia Complications Negative for: history of anesthetic complications  Airway Mallampati: III TM Distance: >3 FB Neck ROM: full    Dental  (+) Teeth Intact   Pulmonary neg pulmonary ROS,  breath sounds clear to auscultation        Cardiovascular negative cardio ROS  Rhythm:regular Rate:Normal     Neuro/Psych negative neurological ROS  negative psych ROS   GI/Hepatic negative GI ROS, Neg liver ROS,   Endo/Other  Morbid obesity  Renal/GU negative Renal ROS  negative genitourinary   Musculoskeletal   Abdominal   Peds  Hematology  (+) Sickle cell trait ,   Anesthesia Other Findings   Reproductive/Obstetrics (+) Pregnancy                           Anesthesia Physical Anesthesia Plan  ASA: III  Anesthesia Plan: Epidural   Post-op Pain Management:    Induction:   Airway Management Planned:   Additional Equipment:   Intra-op Plan:   Post-operative Plan:   Informed Consent: I have reviewed the patients History and Physical, chart, labs and discussed the procedure including the risks, benefits and alternatives for the proposed anesthesia with the patient or authorized representative who has indicated his/her understanding and acceptance.     Plan Discussed with:   Anesthesia Plan Comments:         Anesthesia Quick Evaluation

## 2012-08-16 NOTE — Progress Notes (Signed)
1700- to or.

## 2012-08-17 LAB — CBC
HCT: 31.3 % — ABNORMAL LOW (ref 36.0–46.0)
Hemoglobin: 10.9 g/dL — ABNORMAL LOW (ref 12.0–15.0)
MCHC: 34.8 g/dL (ref 30.0–36.0)

## 2012-08-17 NOTE — Progress Notes (Signed)
Subjective: Postpartum Day 1: Cesarean Delivery Patient reports good pain control, bleeding appropriate. Denies N/V  Objective: Vital signs in last 24 hours: Temp:  [97.6 F (36.4 C)-99.3 F (37.4 C)] 98.4 F (36.9 C) (12/07 0838) Pulse Rate:  [64-208] 97  (12/07 0838) Resp:  [18-29] 20  (12/07 0838) BP: (96-168)/(33-122) 98/67 mmHg (12/07 0838) SpO2:  [97 %-100 %] 100 % (12/07 1610)  Physical Exam:  General: alert, cooperative and appears stated age Lochia: appropriate Uterine Fundus: firm Incision: healing well DVT Evaluation: No evidence of DVT seen on physical exam.   Basename 08/17/12 0518 08/15/12 2005  HGB 10.9* 12.1  HCT 31.3* 35.5*    Assessment/Plan: Status post Cesarean section. Doing well postoperatively.  Continue current care. Discussed neonatal cir. R/B/A reviewed  Maria Fuentes H. 08/17/2012, 8:57 AM

## 2012-08-17 NOTE — Progress Notes (Signed)
Patient was referred for history of depression/anxiety.  * Referral screened out by Clinical Social Worker because none of the following criteria appear to apply:  ~ History of anxiety/depression during this pregnancy, or of post-partum depression.  ~ Diagnosis of anxiety and/or depression within last 3 years  ~ History of depression due to pregnancy loss/loss of child  OR  * Patient's symptoms currently being treated with medication and/or therapy.  Please contact the Clinical Social Worker if needs arise, or by the patient's request.  Pt denies history of anxiety or depression.

## 2012-08-18 NOTE — Progress Notes (Signed)
Subjective: Postpartum Day 2: Cesarean Delivery Patient reports good pain control, no nausea or vomiting. Appropriate bleeding  Objective: Vital signs in last 24 hours: Temp:  [97.7 F (36.5 C)-98.9 F (37.2 C)] 98.2 F (36.8 C) (12/08 0615) Pulse Rate:  [88-106] 88  (12/08 0615) Resp:  [18-20] 18  (12/08 0615) BP: (90-109)/(49-74) 102/67 mmHg (12/08 0615) SpO2:  [98 %-99 %] 98 % (12/07 1655)  Physical Exam:  General: alert, cooperative and appears stated age Lochia: appropriate Uterine Fundus: firm Incision: dressing intact DVT Evaluation: No evidence of DVT seen on physical exam.   Basename 08/17/12 0518 08/15/12 2005  HGB 10.9* 12.1  HCT 31.3* 35.5*    Assessment/Plan: Status post Cesarean section. Doing well postoperatively.  Continue current care. Desires neonatal circ.  R/B/A reviewed. Will proceed  Victorina Kable H. 08/18/2012, 10:22 AM

## 2012-08-19 ENCOUNTER — Encounter (HOSPITAL_COMMUNITY): Payer: Self-pay | Admitting: Obstetrics and Gynecology

## 2012-08-19 NOTE — Discharge Summary (Signed)
Obstetric Discharge Summary Reason for Admission: induction of labor Prenatal Procedures: NST and ultrasound Intrapartum Procedures: cesarean: low cervical, transverse Postpartum Procedures: none Complications-Operative and Postpartum: none Hemoglobin  Date Value Range Status  08/17/2012 10.9* 12.0 - 15.0 g/dL Final     HCT  Date Value Range Status  08/17/2012 31.3* 36.0 - 46.0 % Final    Physical Exam:  General: alert Lochia: appropriate Uterine Fundus: firm Incision: healing well DVT Evaluation: No evidence of DVT seen on physical exam.  Discharge Diagnoses: Term Pregnancy-delivered and Failed induction  Discharge Information: Date: 08/19/2012 Activity: Limited Diet: routine Medications: PNV and Ibuprofen Condition: stable Instructions: refer to practice specific booklet Discharge to: home Follow-up Information    Follow up with Almon Hercules., MD. Schedule an appointment as soon as possible for a visit in 4 weeks.   Contact information:   [address] [place] [address] [phone]          Newborn Data: Live born female  Birth Weight: 8 lb 1.1 oz (3660 g) APGAR: 8, 9  Home with mother.  Maria Fuentes D 08/19/2012, 10:49 AM

## 2012-08-19 NOTE — Plan of Care (Signed)
Problem: Discharge Progression Outcomes Goal: Remove staples per MD order Outcome: Not Met (add Reason) To return to MD office for staple removal

## 2012-08-23 ENCOUNTER — Ambulatory Visit (HOSPITAL_COMMUNITY)
Admit: 2012-08-23 | Discharge: 2012-08-23 | Disposition: A | Discharge status: Home / Self Care | Payer: 59 | Attending: Obstetrics and Gynecology | Admitting: Obstetrics and Gynecology

## 2012-08-23 NOTE — Progress Notes (Signed)
Adult Lactation Consultation Outpatient Visit Note  Patient Name: Maria Fuentes Date of Birth: 1985-11-27 Gestational Age at Delivery: [redacted]w[redacted]d Type of Delivery: C/S   Breastfeeding History: Frequency of Breastfeeding: 4/ day Length of Feeding: 20-40 minutes Voids: "I change him about 4 times per day". Today's diaper was very saturated. Mother states most of the diapers look like that.  Stools: 8-10/ day   Supplementing / Method: Pumping:  Type of Pump: Medela hand pump and PIS. Pt states the pump hurts her so she does hand expression.   Frequency: several times per day  Volume: a little bit each time. Collected 2 oz yesterday over several hours,  Comments:  Mother states she is worried about her baby getting enough to eat. If he is calm then she puts him to the breast. If if cries then she gives him formula instead. She puts 4 oz in the bottle and adds her breast milk. Mother states her baby takes about 1 oz.  Consultation Evaluation:  Mother has large breast and flat nipples. She gets emotional when she thinks her baby isn't getting enough milk from her breast. She responds to his crying by giving formula. Nipples red bleeding because mother has been using small flanges and baby has been  latching to the tip of the nipple.  Mother is very comfortable handling her breast and can compress her tissue to get nipple and areola deep into the baby's mouth. Mother praised for dedication to breastfeeding and expressing. She is very motivated  Initial Feeding Assessment: Pre-feed Weight: left breast for 15 minutes. 3558g Post-feed Weight: 3570g Amount Transferred: 12 ml Comments: Able to easily express milk manually. Nipple trauma noted.  Additional Feeding Assessment: Pre-feed Weight: 3570g Post-feed Weight: 3626 g Amount Transferred: 56ml Comments: Baby fed well. Spit up going to the scale. (milk in the baby's mouth, not swallowed)  Additional Feeding Assessment: Pre-feed Weight: 3626  g Post-feed Weight: Amount Transferred: Comments:  Total Breast milk Transferred this Visit: 68 ml Total Supplement Given: none  Additional Interventions: Instructed mother to breastfeed with cues. Mother is making adequate milk. Made great strides with independent deep latching. Demonstrated confidence with feeding. Mother to express milk if having concerns and offer her EBM to baby by bottle opposed to formula.   Follow-Up  Mother has an appointment at [facility] on Dec 17 at 8:30.  She has a Lactation appointment on Dec 18 at 2:30 for follow up with milk transfer. Mother encouraged to attend Feelings after Birth  And Breastfeeding Support Group.    Christella Hartigan M 08/23/2012, 1:07 PM

## 2012-08-28 ENCOUNTER — Inpatient Hospital Stay (HOSPITAL_COMMUNITY): Admission: RE | Admit: 2012-08-28 | Payer: 59 | Source: Ambulatory Visit

## 2013-09-20 ENCOUNTER — Inpatient Hospital Stay (HOSPITAL_COMMUNITY)
Admission: AD | Admit: 2013-09-20 | Discharge: 2013-09-20 | Disposition: A | Discharge status: Home / Self Care | Payer: No Typology Code available for payment source | Source: Ambulatory Visit | Attending: Obstetrics & Gynecology | Admitting: Obstetrics & Gynecology

## 2013-09-20 ENCOUNTER — Encounter (HOSPITAL_COMMUNITY): Payer: Self-pay | Admitting: *Deleted

## 2013-09-20 DIAGNOSIS — K5289 Other specified noninfective gastroenteritis and colitis: Secondary | ICD-10-CM | POA: Insufficient documentation

## 2013-09-20 DIAGNOSIS — K529 Noninfective gastroenteritis and colitis, unspecified: Secondary | ICD-10-CM

## 2013-09-20 LAB — URINALYSIS, ROUTINE W REFLEX MICROSCOPIC
BILIRUBIN URINE: NEGATIVE
Glucose, UA: NEGATIVE mg/dL
KETONES UR: 15 mg/dL — AB
LEUKOCYTES UA: NEGATIVE
NITRITE: NEGATIVE
PH: 6 (ref 5.0–8.0)
Protein, ur: NEGATIVE mg/dL
SPECIFIC GRAVITY, URINE: 1.025 (ref 1.005–1.030)
UROBILINOGEN UA: 0.2 mg/dL (ref 0.0–1.0)

## 2013-09-20 LAB — URINE MICROSCOPIC-ADD ON

## 2013-09-20 LAB — POCT PREGNANCY, URINE: Preg Test, Ur: NEGATIVE

## 2013-09-20 MED ORDER — METOCLOPRAMIDE HCL 5 MG/ML IJ SOLN
10.0000 mg | Freq: Once | INTRAMUSCULAR | Status: AC
  Administered 2013-09-20: 10 mg via INTRAVENOUS
  Filled 2013-09-20: qty 2

## 2013-09-20 MED ORDER — SODIUM CHLORIDE 0.9 % IV SOLN
Freq: Once | INTRAVENOUS | Status: AC
  Administered 2013-09-20: 22:00:00 via INTRAVENOUS

## 2013-09-20 MED ORDER — ACETAMINOPHEN 500 MG PO TABS
500.0000 mg | ORAL_TABLET | Freq: Once | ORAL | Status: AC
  Administered 2013-09-20: 500 mg via ORAL
  Filled 2013-09-20: qty 1

## 2013-09-20 NOTE — MAU Provider Note (Signed)
History     CSN: [id number]  Arrival date and time: 09/20/13 2017   First Provider Initiated Contact with Patient 09/20/13 2121      Chief Complaint  Patient presents with  . Emesis   HPI  Pt is a G1P1001 here with report of vomiting that began this am. Reports vomiting 5x in past 24 hours.  Denies fever or chills. Denies being around anyone that has been sick. Denies pregnancy and states she has not had intercourse since May 2014. Pt. States she has not been able to keep any liquids or food down since last night.      Past Medical History  Diagnosis Date  . No pertinent past medical history   . H/O varicella   . Sickle cell trait     Past Surgical History  Procedure Laterality Date  . Dilation and curettage of uterus    . Wisdom tooth extraction    . Cesarean section  08/16/2012    Procedure: CESAREAN SECTION;  Surgeon: Freddrick MarchKendra H. Tenny Crawoss, MD;  Location: [facility];  Service: Obstetrics;  Laterality: N/A;    Family History  Problem Relation Age of Onset  . Diabetes Maternal Grandmother   . Hypertension Maternal Grandmother   . Sickle cell trait Mother     History  Substance Use Topics  . Smoking status: Never Smoker   . Smokeless tobacco: Not on file  . Alcohol Use: No    Allergies: No Known Allergies  Prescriptions prior to admission  Medication Sig Dispense Refill  . Prenatal Vit-Fe Fumarate-FA (PRENATAL MULTIVITAMIN) TABS Take 1 tablet by mouth daily.        Review of Systems  Constitutional: Negative for fever and chills.  HENT: Negative for sore throat.   Respiratory: Negative for cough.   Gastrointestinal: Positive for nausea and vomiting. Negative for abdominal pain, diarrhea and constipation.  All other systems reviewed and are negative.   Physical Exam   Blood pressure 120/73, pulse 112, temperature 100.7 F (38.2 C), temperature source Oral, last menstrual period 09/14/2013, not currently breastfeeding.  Physical Exam  Constitutional: She is  oriented to person, place, and time. She appears well-developed and well-nourished. No distress.  Ill appearing  HENT:  Head: Normocephalic.  Neck: Normal range of motion. Neck supple.  Cardiovascular: Normal rate, regular rhythm and normal heart sounds.   Respiratory: Effort normal and breath sounds normal. No respiratory distress.  GI: Soft. There is no tenderness.  Hypoactive bowel sounds  Musculoskeletal: Normal range of motion. She exhibits no edema.  Neurological: She is alert and oriented to person, place, and time. She has normal reflexes.  Skin: Skin is warm and dry.    MAU Course  Procedures  Results for orders placed during the hospital encounter of 09/20/13 (from the past 24 hour(s))  URINALYSIS, ROUTINE W REFLEX MICROSCOPIC     Status: Abnormal   Collection Time    09/20/13  8:35 PM      Result Value Range   Color, Urine YELLOW  YELLOW   APPearance CLEAR  CLEAR   Specific Gravity, Urine 1.025  1.005 - 1.030   pH 6.0  5.0 - 8.0   Glucose, UA NEGATIVE  NEGATIVE mg/dL   Hgb urine dipstick MODERATE (*) NEGATIVE   Bilirubin Urine NEGATIVE  NEGATIVE   Ketones, ur 15 (*) NEGATIVE mg/dL   Protein, ur NEGATIVE  NEGATIVE mg/dL   Urobilinogen, UA 0.2  0.0 - 1.0 mg/dL   Nitrite NEGATIVE  NEGATIVE   Leukocytes,  UA NEGATIVE  NEGATIVE  URINE MICROSCOPIC-ADD ON     Status: Abnormal   Collection Time    09/20/13  8:35 PM      Result Value Range   Squamous Epithelial / LPF FEW (*) RARE   WBC, UA 0-2  <3 WBC/hpf   RBC / HPF 0-2  <3 RBC/hpf   Bacteria, UA FEW (*) RARE   Urine-Other MUCOUS PRESENT    POCT PREGNANCY, URINE     Status: None   Collection Time    09/20/13  8:49 PM      Result Value Range   Preg Test, Ur NEGATIVE  NEGATIVE    Assessment and Plan  Gastroenteritis  Plan: Discharge to home Increase fluids Tylenol prn fever Follow-up if no improvement in 48 hours.    [facility] 09/20/2013, 9:22 PM

## 2013-09-20 NOTE — MAU Provider Note (Signed)
Attestation of Attending Supervision of Advanced Practitioner (CNM/NP): Evaluation and management procedures were performed by the Advanced Practitioner under my supervision and collaboration.  I have reviewed the Advanced Practitioner's note and chart, and I agree with the management and plan.  HARRAWAY-SMITH, Tishie Altmann 11:40 PM

## 2013-09-20 NOTE — MAU Note (Addendum)
Pt. Here for vomiting that began this am. Denies fever or chills. Denies being around anyone that has been sick. Denies pregnancy and states she has not had intercourse since May 2014. Pt. States she has not been able to keep any liquids or food down since last night.

## 2014-07-13 ENCOUNTER — Encounter (HOSPITAL_COMMUNITY): Payer: Self-pay | Admitting: *Deleted

## 2014-11-08 ENCOUNTER — Encounter (HOSPITAL_COMMUNITY): Payer: Self-pay | Admitting: *Deleted

## 2014-11-08 ENCOUNTER — Emergency Department (HOSPITAL_COMMUNITY)
Admission: EM | Admit: 2014-11-08 | Discharge: 2014-11-08 | Disposition: A | Discharge status: Home / Self Care | Payer: Managed Care, Other (non HMO) | Attending: Emergency Medicine | Admitting: Emergency Medicine

## 2014-11-08 DIAGNOSIS — H109 Unspecified conjunctivitis: Secondary | ICD-10-CM

## 2014-11-08 DIAGNOSIS — Z8619 Personal history of other infectious and parasitic diseases: Secondary | ICD-10-CM | POA: Diagnosis not present

## 2014-11-08 DIAGNOSIS — Z862 Personal history of diseases of the blood and blood-forming organs and certain disorders involving the immune mechanism: Secondary | ICD-10-CM | POA: Diagnosis not present

## 2014-11-08 DIAGNOSIS — H579 Unspecified disorder of eye and adnexa: Secondary | ICD-10-CM | POA: Diagnosis present

## 2014-11-08 MED ORDER — POLYMYXIN B-TRIMETHOPRIM 10000-0.1 UNIT/ML-% OP SOLN: 1.0000 [drp] | OPHTHALMIC | Status: DC

## 2014-11-08 NOTE — ED Notes (Signed)
Pt reports Sx'S started last night. Pt reports her son had pink eye.

## 2014-11-08 NOTE — ED Provider Notes (Signed)
CSN: [id number]     Arrival date & time 11/08/14  1043 History  This chart was scribed for non-physician practitioner, Celene Skeen, PA-C working with Gwyneth Sprout, MD by Freida Busman, ED Scribe. This patient was seen in room TR04C/TR04C and the patient's care was started at 11:02 AM.     No chief complaint on file.   The history is provided by the patient. No language interpreter was used.     HPI Comments:  Maria Fuentes is a 29 y.o. female who presents to the Emergency Department complaining of erythema to her right  which she noticed last night. She states she woke up this am with her right eye crusted over. Pt states her son was diagnosed with pink eye 5 days ago. She denies vision changes. She has no other complaints or symptoms at this time. No alleviating factors noted.   Past Medical History  Diagnosis Date  . No pertinent past medical history   . H/O varicella   . Sickle cell trait    Past Surgical History  Procedure Laterality Date  . Dilation and curettage of uterus    . Wisdom tooth extraction    . Cesarean section  08/16/2012    Procedure: CESAREAN SECTION;  Surgeon: Freddrick March. Tenny Craw, MD;  Location: [facility];  Service: Obstetrics;  Laterality: N/A;   Family History  Problem Relation Age of Onset  . Diabetes Maternal Grandmother   . Hypertension Maternal Grandmother   . Sickle cell trait Mother    History  Substance Use Topics  . Smoking status: Never Smoker   . Smokeless tobacco: Not on file  . Alcohol Use: No   OB History    Gravida Para Term Preterm AB TAB SAB Ectopic Multiple Living   0 0    1     Review of Systems  Constitutional: Negative for fever and chills.  Eyes: Positive for redness. Negative for visual disturbance.      Allergies  Review of patient's allergies indicates no known allergies.  Home Medications   Prior to Admission medications   Medication Sig Start Date End Date Taking? Authorizing Provider  Prenatal Vit-Fe  Fumarate-FA (PRENATAL MULTIVITAMIN) TABS Take 1 tablet by mouth daily.    Historical Provider, MD  trimethoprim-polymyxin b (POLYTRIM) ophthalmic solution Place 1 drop into the right eye every 4 (four) hours. 11/08/14   Markel Kurtenbach M Kasai Beltran, PA-C   BP 104/63 mmHg  Pulse 82  Temp(Src) 98.4 F (36.9 C) (Oral)  Resp 16  Ht  (1.651 m)  Wt 235 lb (106.595 kg)  BMI 39.11 kg/m2  SpO2 99%  LMP 10/25/2014 Physical Exam  Constitutional: She is oriented to person, place, and time. She appears well-developed and well-nourished. No distress.  HENT:  Head: Normocephalic and atraumatic.  Mouth/Throat: Oropharynx is clear and moist.  Eyes: EOM and lids are normal. Pupils are equal, round, and reactive to light.  Right conjunctival injection. Dried exudate at right nasal canthus.  Neck: Normal range of motion. Neck supple.  Cardiovascular: Normal rate, regular rhythm and normal heart sounds.   Pulmonary/Chest: Effort normal and breath sounds normal. No respiratory distress.  Musculoskeletal: Normal range of motion. She exhibits no edema.  Neurological: She is alert and oriented to person, place, and time. No sensory deficit.  Skin: Skin is warm and dry.  Psychiatric: She has a normal mood and affect. Her behavior is normal.  Nursing note and vitals reviewed.   ED Course  Procedures  DIAGNOSTIC STUDIES:  Oxygen Saturation is 99% on RA, normal by my interpretation.    COORDINATION OF CARE:  11:05 AM Will discharge with eye drops. Discussed treatment plan with pt at bedside and pt agreed to plan.  Labs Review Labs Reviewed - No data to display  Imaging Review No results found.   EKG Interpretation None      MDM   Final diagnoses:  Right conjunctivitis   Treat with polytrim drops. Infection care/precautions discussed. Stable for d/c. Return precautions given. Patient states understanding of treatment care plan and is agreeable.  I personally performed the services described in  this documentation, which was scribed in my presence. The recorded information has been reviewed and is accurate.     Kathrynn SpeedRobyn M Novalynn Branaman, PA-C 11/08/14 1107  Gwyneth SproutWhitney Plunkett, MD 11/08/14 1539

## 2014-11-08 NOTE — Discharge Instructions (Signed)
Apply topical eye drops every 4 hours for 5 days. Apply warm compresses. Follow up with your doctor in 3-5 days if no improvement.  Conjunctivitis Conjunctivitis is commonly called "pink eye." Conjunctivitis can be caused by bacterial or viral infection, allergies, or injuries. There is usually redness of the lining of the eye, itching, discomfort, and sometimes discharge. There may be deposits of matter along the eyelids. A viral infection usually causes a watery discharge, while a bacterial infection causes a yellowish, thick discharge. Pink eye is very contagious and spreads by direct contact. You may be given antibiotic eyedrops as part of your treatment. Before using your eye medicine, remove all drainage from the eye by washing gently with warm water and cotton balls. Continue to use the medication until you have awakened 2 mornings in a row without discharge from the eye. Do not rub your eye. This increases the irritation and helps spread infection. Use separate towels from other household members. Wash your hands with soap and water before and after touching your eyes. Use cold compresses to reduce pain and sunglasses to relieve irritation from light. Do not wear contact lenses or wear eye makeup until the infection is gone. SEEK MEDICAL CARE IF:   Your symptoms are not better after 3 days of treatment.  You have increased pain or trouble seeing.  The outer eyelids become very red or swollen. Document Released: 10/05/2004 Document Revised: 11/20/2011 Document Reviewed: 08/28/2005 [facility] Patient Information 2015 [place], [place]. This information is not intended to replace advice given to you by your health care provider. Make sure you discuss any questions you have with your health care provider.

## 2014-11-08 NOTE — ED Notes (Signed)
Declined W/C at D/C and was escorted to lobby by RN. 

## 2016-04-03 ENCOUNTER — Inpatient Hospital Stay (HOSPITAL_COMMUNITY)
Admission: AD | Admit: 2016-04-03 | Discharge: 2016-04-03 | Disposition: A | Discharge status: Home / Self Care | Payer: Managed Care, Other (non HMO) | Source: Ambulatory Visit | Attending: Family Medicine | Admitting: Family Medicine

## 2016-04-03 ENCOUNTER — Encounter (HOSPITAL_COMMUNITY): Payer: Self-pay | Admitting: *Deleted

## 2016-04-03 ENCOUNTER — Inpatient Hospital Stay (HOSPITAL_COMMUNITY): Payer: Managed Care, Other (non HMO)

## 2016-04-03 DIAGNOSIS — N760 Acute vaginitis: Secondary | ICD-10-CM | POA: Insufficient documentation

## 2016-04-03 DIAGNOSIS — R1030 Lower abdominal pain, unspecified: Secondary | ICD-10-CM | POA: Insufficient documentation

## 2016-04-03 DIAGNOSIS — O9989 Other specified diseases and conditions complicating pregnancy, childbirth and the puerperium: Secondary | ICD-10-CM | POA: Diagnosis not present

## 2016-04-03 DIAGNOSIS — O23591 Infection of other part of genital tract in pregnancy, first trimester: Secondary | ICD-10-CM | POA: Diagnosis not present

## 2016-04-03 DIAGNOSIS — O26899 Other specified pregnancy related conditions, unspecified trimester: Secondary | ICD-10-CM

## 2016-04-03 DIAGNOSIS — B9689 Other specified bacterial agents as the cause of diseases classified elsewhere: Secondary | ICD-10-CM

## 2016-04-03 DIAGNOSIS — D573 Sickle-cell trait: Secondary | ICD-10-CM | POA: Insufficient documentation

## 2016-04-03 DIAGNOSIS — O26891 Other specified pregnancy related conditions, first trimester: Secondary | ICD-10-CM | POA: Diagnosis not present

## 2016-04-03 DIAGNOSIS — Z3A08 8 weeks gestation of pregnancy: Secondary | ICD-10-CM | POA: Diagnosis not present

## 2016-04-03 DIAGNOSIS — R109 Unspecified abdominal pain: Secondary | ICD-10-CM

## 2016-04-03 LAB — CBC
HCT: 32.2 % — ABNORMAL LOW (ref 36.0–46.0)
Hemoglobin: 11.3 g/dL — ABNORMAL LOW (ref 12.0–15.0)
MCH: 28 pg (ref 26.0–34.0)
MCHC: 35.1 g/dL (ref 30.0–36.0)
MCV: 79.7 fL (ref 78.0–100.0)
Platelets: 281 10*3/uL (ref 150–400)
RBC: 4.04 MIL/uL (ref 3.87–5.11)
RDW: 13.7 % (ref 11.5–15.5)
WBC: 12.3 10*3/uL — ABNORMAL HIGH (ref 4.0–10.5)

## 2016-04-03 LAB — URINALYSIS, ROUTINE W REFLEX MICROSCOPIC
Bilirubin Urine: NEGATIVE
Glucose, UA: NEGATIVE mg/dL
Ketones, ur: NEGATIVE mg/dL
LEUKOCYTES UA: NEGATIVE
NITRITE: NEGATIVE
PH: 5.5 (ref 5.0–8.0)
Protein, ur: NEGATIVE mg/dL
Specific Gravity, Urine: 1.01 (ref 1.005–1.030)

## 2016-04-03 LAB — URINE MICROSCOPIC-ADD ON: WBC UA: NONE SEEN WBC/hpf (ref 0–5)

## 2016-04-03 LAB — WET PREP, GENITAL
SPERM: NONE SEEN
Trich, Wet Prep: NONE SEEN
YEAST WET PREP: NONE SEEN

## 2016-04-03 LAB — HCG, QUANTITATIVE, PREGNANCY: hCG, Beta Chain, Quant, S: 73406 m[IU]/mL — ABNORMAL HIGH (ref <5)

## 2016-04-03 LAB — ABO/RH: ABO/RH(D): A POS

## 2016-04-03 LAB — POCT PREGNANCY, URINE: Preg Test, Ur: POSITIVE — AB

## 2016-04-03 MED ORDER — METRONIDAZOLE 500 MG PO TABS: 500.0000 mg | ORAL_TABLET | Freq: Two times a day (BID) | ORAL | 0 refills | Status: AC

## 2016-04-03 NOTE — Discharge Instructions (Signed)
Bacterial Vaginosis °Bacterial vaginosis is a vaginal infection that occurs when the normal balance of bacteria in the vagina is disrupted. It results from an overgrowth of certain bacteria. This is the most common vaginal infection in women of childbearing age. Treatment is important to prevent complications, especially in pregnant women, as it can cause a premature delivery. °CAUSES  °Bacterial vaginosis is caused by an increase in harmful bacteria that are normally present in smaller amounts in the vagina. Several different kinds of bacteria can cause bacterial vaginosis. However, the reason that the condition develops is not fully understood. °RISK FACTORS °Certain activities or behaviors can put you at an increased risk of developing bacterial vaginosis, including: °· Having a new sex partner or multiple sex partners. °· Douching. °· Using an intrauterine device (IUD) for contraception. °Women do not get bacterial vaginosis from toilet seats, bedding, swimming pools, or contact with objects around them. °SIGNS AND SYMPTOMS  °Some women with bacterial vaginosis have no signs or symptoms. Common symptoms include: °· Grey vaginal discharge. °· A fishlike odor with discharge, especially after sexual intercourse. °· Itching or burning of the vagina and vulva. °· Burning or pain with urination. °DIAGNOSIS  °Your health care provider will take a medical history and examine the vagina for signs of bacterial vaginosis. A sample of vaginal fluid may be taken. Your health care provider will look at this sample under a microscope to check for bacteria and abnormal cells. A vaginal pH test may also be done.  °TREATMENT  °Bacterial vaginosis may be treated with antibiotic medicines. These may be given in the form of a pill or a vaginal cream. A second round of antibiotics may be prescribed if the condition comes back after treatment. Because bacterial vaginosis increases your risk for sexually transmitted diseases, getting  treated can help reduce your risk for chlamydia, gonorrhea, HIV, and herpes. °HOME CARE INSTRUCTIONS  °· Only take over-the-counter or prescription medicines as directed by your health care provider. °· If antibiotic medicine was prescribed, take it as directed. Make sure you finish it even if you start to feel better. °· Tell all sexual partners that you have a vaginal infection. They should see their health care provider and be treated if they have problems, such as a mild rash or itching. °· During treatment, it is important that you follow these instructions: °· Avoid sexual activity or use condoms correctly. °· Do not douche. °· Avoid alcohol as directed by your health care provider. °· Avoid breastfeeding as directed by your health care provider. °SEEK MEDICAL CARE IF:  °· Your symptoms are not improving after 3 days of treatment. °· You have increased discharge or pain. °· You have a fever. °MAKE SURE YOU:  °· Understand these instructions. °· Will watch your condition. °· Will get help right away if you are not doing well or get worse. °FOR MORE INFORMATION  °Centers for Disease Control and Prevention, Division of STD Prevention: www.cdc.gov/std °American Sexual Health Association (ASHA): www.ashastd.org  °  °This information is not intended to replace advice given to you by your health care provider. Make sure you discuss any questions you have with your health care provider. °  °Document Released: 08/28/2005 Document Revised: 09/18/2014 Document Reviewed: 04/09/2013 °[employer]vier Interactive Patient Education ©2016 [employer] °First Trimester of Pregnancy °The first trimester of pregnancy is from week 1 until the end of week 12 (months 1 through 3). A week after a sperm fertilizes an egg, the egg will implant on the   wall of the uterus. This embryo will begin to develop into a baby. Genes from you and your partner are forming the baby. The female genes determine whether the baby is a boy or a girl. At 6-8  weeks, the eyes and face are formed, and the heartbeat can be seen on ultrasound. At the end of 12 weeks, all the baby's organs are formed.  °Now that you are pregnant, you will want to do everything you can to have a healthy baby. Two of the most important things are to get good prenatal care and to follow your health care provider's instructions. Prenatal care is all the medical care you receive before the baby's birth. This care will help prevent, find, and treat any problems during the pregnancy and childbirth. °BODY CHANGES °Your body goes through many changes during pregnancy. The changes vary from woman to woman.  °· You may gain or lose a couple of pounds at first. °· You may feel sick to your stomach (nauseous) and throw up (vomit). If the vomiting is uncontrollable, call your health care provider. °· You may tire easily. °· You may develop headaches that can be relieved by medicines approved by your health care provider. °· You may urinate more often. Painful urination may mean you have a bladder infection. °· You may develop heartburn as a result of your pregnancy. °· You may develop constipation because certain hormones are causing the muscles that push waste through your intestines to slow down. °· You may develop hemorrhoids or swollen, bulging veins (varicose veins). °· Your breasts may begin to grow larger and become tender. Your nipples may stick out more, and the tissue that surrounds them (areola) may become darker. °· Your gums may bleed and may be sensitive to brushing and flossing. °· Dark spots or blotches (chloasma, mask of pregnancy) may develop on your face. This will likely fade after the baby is born. °· Your menstrual periods will stop. °· You may have a loss of appetite. °· You may develop cravings for certain kinds of food. °· You may have changes in your emotions from day to day, such as being excited to be pregnant or being concerned that something may go wrong with the pregnancy and  baby. °· You may have more vivid and strange dreams. °· You may have changes in your hair. These can include thickening of your hair, rapid growth, and changes in texture. Some women also have hair loss during or after pregnancy, or hair that feels dry or thin. Your hair will most likely return to normal after your baby is born. °WHAT TO EXPECT AT YOUR PRENATAL VISITS °During a routine prenatal visit: °· You will be weighed to make sure you and the baby are growing normally. °· Your blood pressure will be taken. °· Your abdomen will be measured to track your baby's growth. °· The fetal heartbeat will be listened to starting around week 10 or 12 of your pregnancy. °· Test results from any previous visits will be discussed. °Your health care provider may ask you: °· How you are feeling. °· If you are feeling the baby move. °· If you have had any abnormal symptoms, such as leaking fluid, bleeding, severe headaches, or abdominal cramping. °· If you are using any tobacco products, including cigarettes, chewing tobacco, and electronic cigarettes. °· If you have any questions. °Other tests that may be performed during your first trimester include: °· Blood tests to find your blood type and to check for   the presence of any previous infections. They will also be used to check for low iron levels (anemia) and Rh antibodies. Later in the pregnancy, blood tests for diabetes will be done along with other tests if problems develop. °· Urine tests to check for infections, diabetes, or protein in the urine. °· An ultrasound to confirm the proper growth and development of the baby. °· An amniocentesis to check for possible genetic problems. °· Fetal screens for spina bifida and Down syndrome. °· You may need other tests to make sure you and the baby are doing well. °· HIV (human immunodeficiency virus) testing. Routine prenatal testing includes screening for HIV, unless you choose not to have this test. °HOME CARE INSTRUCTIONS    °Medicines °· Follow your health care provider's instructions regarding medicine use. Specific medicines may be either safe or unsafe to take during pregnancy. °· Take your prenatal vitamins as directed. °· If you develop constipation, try taking a stool softener if your health care provider approves. °Diet °· Eat regular, well-balanced meals. Choose a variety of foods, such as meat or vegetable-based protein, fish, milk and low-fat dairy products, vegetables, fruits, and whole grain breads and cereals. Your health care provider will help you determine the amount of weight gain that is right for you. °· Avoid raw meat and uncooked cheese. These carry germs that can cause birth defects in the baby. °· Eating four or five small meals rather than three large meals a day may help relieve nausea and vomiting. If you start to feel nauseous, eating a few soda crackers can be helpful. Drinking liquids between meals instead of during meals also seems to help nausea and vomiting. °· If you develop constipation, eat more high-fiber foods, such as fresh vegetables or fruit and whole grains. Drink enough fluids to keep your urine clear or pale yellow. °Activity and Exercise °· Exercise only as directed by your health care provider. Exercising will help you: °¨ Control your weight. °¨ Stay in shape. °¨ Be prepared for labor and delivery. °· Experiencing pain or cramping in the lower abdomen or low back is a good sign that you should stop exercising. Check with your health care provider before continuing normal exercises. °· Try to avoid standing for long periods of time. Move your legs often if you must stand in one place for a long time. °· Avoid heavy lifting. °· Wear low-heeled shoes, and practice good posture. °· You may continue to have sex unless your health care provider directs you otherwise. °Relief of Pain or Discomfort °· Wear a good support bra for breast tenderness.   °· Take warm sitz baths to soothe any pain or  discomfort caused by hemorrhoids. Use hemorrhoid cream if your health care provider approves.   °· Rest with your legs elevated if you have leg cramps or low back pain. °· If you develop varicose veins in your legs, wear support hose. Elevate your feet for 15 minutes, 3-4 times a day. Limit salt in your diet. °Prenatal Care °· Schedule your prenatal visits by the twelfth week of pregnancy. They are usually scheduled monthly at first, then more often in the last 2 months before delivery. °· Write down your questions. Take them to your prenatal visits. °· Keep all your prenatal visits as directed by your health care provider. °Safety °· Wear your seat belt at all times when driving. °· Make a list of emergency phone numbers, including numbers for family, friends, the hospital, and police and fire departments. °General   Tips °· Ask your health care provider for a referral to a local prenatal education class. Begin classes no later than at the beginning of month 6 of your pregnancy. °· Ask for help if you have counseling or nutritional needs during pregnancy. Your health care provider can offer advice or refer you to specialists for help with various needs. °· Do not use hot tubs, steam rooms, or saunas. °· Do not douche or use tampons or scented sanitary pads. °· Do not cross your legs for long periods of time. °· Avoid cat litter boxes and soil used by cats. These carry germs that can cause birth defects in the baby and possibly loss of the fetus by miscarriage or stillbirth. °· Avoid all smoking, herbs, alcohol, and medicines not prescribed by your health care provider. Chemicals in these affect the formation and growth of the baby. °· Do not use any tobacco products, including cigarettes, chewing tobacco, and electronic cigarettes. If you need help quitting, ask your health care provider. You may receive counseling support and other resources to help you quit. °· Schedule a dentist appointment. At home, brush your  teeth with a soft toothbrush and be gentle when you floss. °SEEK MEDICAL CARE IF:  °· You have dizziness. °· You have mild pelvic cramps, pelvic pressure, or nagging pain in the abdominal area. °· You have persistent nausea, vomiting, or diarrhea. °· You have a bad smelling vaginal discharge. °· You have pain with urination. °· You notice increased swelling in your face, hands, legs, or ankles. °SEEK IMMEDIATE MEDICAL CARE IF:  °· You have a fever. °· You are leaking fluid from your vagina. °· You have spotting or bleeding from your vagina. °· You have severe abdominal cramping or pain. °· You have rapid weight gain or loss. °· You vomit blood or material that looks like coffee grounds. °· You are exposed to German measles and have never had them. °· You are exposed to fifth disease or chickenpox. °· You develop a severe headache. °· You have shortness of breath. °· You have any kind of trauma, such as from a fall or a car accident. °  °This information is not intended to replace advice given to you by your health care provider. Make sure you discuss any questions you have with your health care provider. °  °Document Released: 08/22/2001 Document Revised: 09/18/2014 Document Reviewed: 07/08/2013 °[employer] Interactive Patient Education ©2016 [employer] ° °

## 2016-04-03 NOTE — MAU Provider Note (Signed)
Chief Complaint: Abdominal Cramping   First Provider Initiated Contact with Patient 04/03/16 1943        SUBJECTIVE   Maria Fuentes is a 30 y.o. G1P1001 at [redacted]w[redacted]d by LMP who presents to maternity admissions reporting cramping in lower abdomen. ALso has some constipation, has felt hardness in her upper abdomen, thinks it is stool.  Has not tried any fiber or Miralax.  Last good BM a week ago.. She denies vaginal bleeding, vaginal itching/burning, urinary symptoms, h/a, dizziness, n/v, or fever/chills.   Abdominal Cramping  This is a new problem. The current episode started in the past 7 days. The onset quality is gradual. The problem occurs intermittently. The problem has been unchanged. The pain is located in the suprapubic region. The pain is mild. The quality of the pain is cramping. The abdominal pain does not radiate. Associated symptoms include constipation. Pertinent negatives include no anorexia, diarrhea, dysuria, fever, nausea or vomiting. Nothing aggravates the pain. The pain is relieved by nothing. She has tried nothing for the symptoms.     RN note: The top of her stomach is getting hard, first noted a couple wks ago. Has been cramping in lower abd since first wk in July  Past Medical History:  Diagnosis Date  . H/O varicella   . No pertinent past medical history   . Sickle cell trait [facility])    Past Surgical History:  Procedure Laterality Date  . CESAREAN SECTION  08/16/2012   Procedure: CESAREAN SECTION;  Surgeon: Freddrick March. Tenny Craw, MD;  Location: [facility] ORS;  Service: Obstetrics;  Laterality: N/A;  . DILATION AND CURETTAGE OF UTERUS    . WISDOM TOOTH EXTRACTION     Social History   Social History  . Marital status: Single    Spouse name: N/A  . Number of children: N/A  . Years of education: N/A   Occupational History  . Not on file.   Social History Main Topics  . Smoking status: Never Smoker  . Smokeless tobacco: Not on file  . Alcohol use No  . Drug use: No  .  Sexual activity: Not Currently    Birth control/ protection: None     Comment: intercourse since May 2014   Other Topics Concern  . Not on file   Social History Narrative  . No narrative on file   No current facility-administered medications on file prior to encounter.    Current Outpatient Prescriptions on File Prior to Encounter  Medication Sig Dispense Refill  . Prenatal Vit-Fe Fumarate-FA (PRENATAL MULTIVITAMIN) TABS Take 1 tablet by mouth daily.    Marland Kitchen trimethoprim-polymyxin b (POLYTRIM) ophthalmic solution Place 1 drop into the right eye every 4 (four) hours. 10 mL 0   No Known Allergies  I have reviewed patient's Past Medical Hx, Surgical Hx, Family Hx, Social Hx, medications and allergies.   ROS:  Review of Systems  Constitutional: Negative for chills and fever.  Respiratory: Negative for shortness of breath.   Gastrointestinal: Positive for abdominal pain and constipation. Negative for anorexia, diarrhea, nausea and vomiting.  Genitourinary: Positive for pelvic pain and vaginal discharge. Negative for dysuria and vaginal bleeding.    Other systems negative  Physical Exam  Patient Vitals for the past 24 hrs:  BP Temp Temp src Pulse Resp Height Weight  04/03/16 1848 122/59 98.6 F (37 C) Oral 97 18 5' 2.5" (1.588 m) 107.7 kg (237 lb 8 oz)    Physical Exam  Constitutional: Well-developed, well-nourished female in no acute distress.  Cardiovascular: normal rate Respiratory: normal effort GI: Abd soft, non-tender. Pos BS x 4 MS: Extremities nontender, no edema, normal ROM Neurologic: Alert and oriented x 4.  GU: Neg CVAT.  PELVIC EXAM: Cervix pink, visually closed, without lesion, scant white creamy discharge, vaginal walls and external genitalia normal Bimanual exam: Cervix 0/long/high, firm, anterior, neg CMT, uterus nontender, nonenlarged, adnexa without tenderness, enlargement, or mass   LAB RESULTS Results for orders placed or performed during the hospital  encounter of 04/03/16 (from the past 24 hour(s))  Urinalysis, Routine w reflex microscopic (not at [facility])     Status: Abnormal   Collection Time: 04/03/16  6:50 PM  Result Value Ref Range   Color, Urine YELLOW YELLOW   APPearance CLEAR CLEAR   Specific Gravity, Urine 1.010 1.005 - 1.030   pH 5.5 5.0 - 8.0   Glucose, UA NEGATIVE NEGATIVE mg/dL   Hgb urine dipstick TRACE (A) NEGATIVE   Bilirubin Urine NEGATIVE NEGATIVE   Ketones, ur NEGATIVE NEGATIVE mg/dL   Protein, ur NEGATIVE NEGATIVE mg/dL   Nitrite NEGATIVE NEGATIVE   Leukocytes, UA NEGATIVE NEGATIVE  Urine microscopic-add on     Status: Abnormal   Collection Time: 04/03/16  6:50 PM  Result Value Ref Range   Squamous Epithelial / LPF 0-5 (A) NONE SEEN   WBC, UA NONE SEEN 0 - 5 WBC/hpf   RBC / HPF 0-5 0 - 5 RBC/hpf   Bacteria, UA RARE (A) NONE SEEN  Pregnancy, urine POC     Status: Abnormal   Collection Time: 04/03/16  6:56 PM  Result Value Ref Range   Preg Test, Ur POSITIVE (A) NEGATIVE  CBC     Status: Abnormal   Collection Time: 04/03/16  7:58 PM  Result Value Ref Range   WBC 12.3 (H) 4.0 - 10.5 K/uL   RBC 4.04 3.87 - 5.11 MIL/uL   Hemoglobin 11.3 (L) 12.0 - 15.0 g/dL   HCT 16.1 (L) 09.6 - 04.5 %   MCV 79.7 78.0 - 100.0 fL   MCH 28.0 26.0 - 34.0 pg   MCHC 35.1 30.0 - 36.0 g/dL   RDW 40.9 81.1 - 91.4 %   Platelets 281 150 - 400 K/uL  hCG, quantitative, pregnancy     Status: Abnormal   Collection Time: 04/03/16  7:58 PM  Result Value Ref Range   hCG, Beta Chain, Quant, S 73,406 (H) <5 mIU/mL  ABO/Rh     Status: None   Collection Time: 04/03/16  7:58 PM  Result Value Ref Range   ABO/RH(D) A POS   Wet prep, genital     Status: Abnormal   Collection Time: 04/03/16  8:10 PM  Result Value Ref Range   Yeast Wet Prep HPF POC NONE SEEN NONE SEEN   Trich, Wet Prep NONE SEEN NONE SEEN   Clue Cells Wet Prep HPF POC PRESENT (A) NONE SEEN   WBC, Wet Prep HPF POC FEW (A) NONE SEEN   Sperm NONE SEEN        IMAGING US Ob  Comp Less 14 Wks  Result Date: 04/03/2016 CLINICAL DATA:  Patient reportedly pregnant. Quantitative beta HCG level is pending. Estimated gestational age is 5 weeks and 4 days based on the last menstrual period. EXAM: OBSTETRIC <14 WK [place] AND TRANSVAGINAL OB US TECHNIQUE: Both transabdominal and transvaginal ultrasound examinations were performed for complete evaluation of the gestation as well as the maternal uterus, adnexal regions, and pelvic cul-de-sac. Transvaginal technique was performed to assess early pregnancy. COMPARISON:  None. FINDINGS: Intrauterine gestational sac: Yes Yolk sac:  Yes Embryo:  Yes Cardiac Activity: Yes Heart Rate: 169  bpm CRL:  22.4  mm   8 w   6 d                  [place] EDC: 11/07/2016 Subchorionic hemorrhage:  None visualized. Maternal uterus/adnexae: 2 small, sub cm, peripheral, mural uterine masses consistent with fibroids. Largest measures 7 mm. No other uterine abnormality. No adnexal masses. No free fluid. 2.8 cm probable left ovarian corpus luteum. IMPRESSION: 1. Single live intrauterine pregnancy with a measured gestational age of [redacted] weeks and 6 days. No sonographic evidence of a pregnancy complication. No emergent maternal abnormality. 2. 2 subcentimeter mural fibroids. Electronically Signed   By: Amie Portland M.D.   On: 04/03/2016 20:43  US Ob Transvaginal  Result Date: 04/03/2016 CLINICAL DATA:  Patient reportedly pregnant. Quantitative beta HCG level is pending. Estimated gestational age is 5 weeks and 4 days based on the last menstrual period. EXAM: OBSTETRIC <14 WK [place] AND TRANSVAGINAL OB US TECHNIQUE: Both transabdominal and transvaginal ultrasound examinations were performed for complete evaluation of the gestation as well as the maternal uterus, adnexal regions, and pelvic cul-de-sac. Transvaginal technique was performed to assess early pregnancy. COMPARISON:  None. FINDINGS: Intrauterine gestational sac: Yes Yolk sac:  Yes Embryo:  Yes Cardiac Activity: Yes Heart  Rate: 169  bpm CRL:  22.4  mm   8 w   6 d                  [place] EDC: 11/07/2016 Subchorionic hemorrhage:  None visualized. Maternal uterus/adnexae: 2 small, sub cm, peripheral, mural uterine masses consistent with fibroids. Largest measures 7 mm. No other uterine abnormality. No adnexal masses. No free fluid. 2.8 cm probable left ovarian corpus luteum. IMPRESSION: 1. Single live intrauterine pregnancy with a measured gestational age of [redacted] weeks and 6 days. No sonographic evidence of a pregnancy complication. No emergent maternal abnormality. 2. 2 subcentimeter mural fibroids. Electronically Signed   By: Amie Portland M.D.   On: 04/03/2016 20:43   MAU Management/MDM: Ordered usual first trimester r/o ectopic labs.   Pelvic exam and cultures done Will check baseline Ultrasound to rule out ectopic.   This bleeding/pain can represent a normal pregnancy with bleeding, spontaneous abortion or even an ectopic which can be life-threatening.  The process as listed above helps to determine which of these is present.  [place] confirms single intrauterine pregnancy, live Wet prep showed BV   ASSESSMENT 1. Abdominal pain during pregnancy   2.    Bacterial vaginosis 3.  SIUP at [redacted]w[redacted]d   PLAN Discharge home First trimester instructions provided Follow up with Dr Tenny Craw for prenatal care (has not called them yet)    Medication List    ASK your doctor about these medications   prenatal multivitamin Tabs tablet Take 1 tablet by mouth daily.   trimethoprim-polymyxin b ophthalmic solution Commonly known as:  POLYTRIM Place 1 drop into the right eye every 4 (four) hours.       Pt stable at time of discharge. Encouraged to return here or to other Urgent Care/ED if she develops worsening of symptoms, increase in pain, fever, or other concerning symptoms.    Wynelle Bourgeois CNM, MSN Certified Nurse-Midwife 04/03/2016  7:44 PM

## 2016-04-03 NOTE — MAU Note (Signed)
The top of her stomach is getting hard, first noted a couple wks ago. Has been cramping in lower abd since first wk in July.

## 2016-04-04 LAB — GC/CHLAMYDIA PROBE AMP (~~LOC~~) NOT AT ARMC
Chlamydia: NEGATIVE
Neisseria Gonorrhea: NEGATIVE

## 2016-04-10 ENCOUNTER — Other Ambulatory Visit: Payer: Self-pay | Admitting: Obstetrics and Gynecology

## 2016-04-10 DIAGNOSIS — Z6841 Body Mass Index (BMI) 40.0 and over, adult: Secondary | ICD-10-CM

## 2016-04-11 LAB — CYTOLOGY - PAP

## 2016-05-16 ENCOUNTER — Other Ambulatory Visit (HOSPITAL_COMMUNITY): Payer: Self-pay | Admitting: Obstetrics and Gynecology

## 2016-05-16 DIAGNOSIS — Z3A14 14 weeks gestation of pregnancy: Secondary | ICD-10-CM

## 2016-05-16 DIAGNOSIS — Z3689 Encounter for other specified antenatal screening: Secondary | ICD-10-CM

## 2016-05-30 ENCOUNTER — Encounter (HOSPITAL_COMMUNITY): Payer: Self-pay | Admitting: Obstetrics and Gynecology

## 2016-06-05 ENCOUNTER — Ambulatory Visit (HOSPITAL_COMMUNITY): Payer: Managed Care, Other (non HMO)

## 2016-06-06 ENCOUNTER — Ambulatory Visit (HOSPITAL_COMMUNITY): Payer: Managed Care, Other (non HMO)

## 2016-06-06 ENCOUNTER — Encounter (HOSPITAL_COMMUNITY): Payer: No Typology Code available for payment source

## 2016-06-06 ENCOUNTER — Encounter (HOSPITAL_COMMUNITY): Payer: Self-pay

## 2016-06-06 ENCOUNTER — Ambulatory Visit (HOSPITAL_COMMUNITY)
Admission: RE | Admit: 2016-06-06 | Discharge: 2016-06-06 | Disposition: A | Discharge status: Home / Self Care | Payer: Managed Care, Other (non HMO) | Source: Ambulatory Visit | Attending: Obstetrics and Gynecology | Admitting: Obstetrics and Gynecology

## 2016-07-11 ENCOUNTER — Ambulatory Visit (HOSPITAL_COMMUNITY): Payer: No Typology Code available for payment source

## 2016-07-28 ENCOUNTER — Other Ambulatory Visit (HOSPITAL_COMMUNITY): Payer: Self-pay | Admitting: *Deleted

## 2016-07-28 ENCOUNTER — Encounter (HOSPITAL_COMMUNITY): Payer: Self-pay

## 2016-07-28 ENCOUNTER — Ambulatory Visit (HOSPITAL_COMMUNITY)
Admission: RE | Admit: 2016-07-28 | Discharge: 2016-07-28 | Disposition: A | Discharge status: Home / Self Care | Payer: Managed Care, Other (non HMO) | Source: Ambulatory Visit | Attending: Obstetrics and Gynecology | Admitting: Obstetrics and Gynecology

## 2016-07-28 ENCOUNTER — Ambulatory Visit (HOSPITAL_COMMUNITY): Admission: RE | Admit: 2016-07-28 | Payer: Managed Care, Other (non HMO) | Source: Ambulatory Visit

## 2016-07-28 ENCOUNTER — Other Ambulatory Visit (HOSPITAL_COMMUNITY): Payer: Self-pay | Admitting: Obstetrics and Gynecology

## 2016-07-28 DIAGNOSIS — Z315 Encounter for genetic counseling: Secondary | ICD-10-CM | POA: Diagnosis not present

## 2016-07-28 DIAGNOSIS — Z3A25 25 weeks gestation of pregnancy: Secondary | ICD-10-CM

## 2016-07-28 DIAGNOSIS — O34219 Maternal care for unspecified type scar from previous cesarean delivery: Secondary | ICD-10-CM

## 2016-07-28 DIAGNOSIS — Z3689 Encounter for other specified antenatal screening: Secondary | ICD-10-CM

## 2016-07-28 DIAGNOSIS — O409XX0 Polyhydramnios, unspecified trimester, not applicable or unspecified: Secondary | ICD-10-CM

## 2016-07-28 DIAGNOSIS — Z862 Personal history of diseases of the blood and blood-forming organs and certain disorders involving the immune mechanism: Secondary | ICD-10-CM

## 2016-07-28 DIAGNOSIS — Z3A14 14 weeks gestation of pregnancy: Secondary | ICD-10-CM

## 2016-07-28 DIAGNOSIS — D573 Sickle-cell trait: Secondary | ICD-10-CM | POA: Insufficient documentation

## 2016-07-28 NOTE — Progress Notes (Signed)
[name]  High-Risk Gestation Note  Appointment Date:  07/28/2016 Referred By: Allyn Kenner, DO Date of Birth:  [birth year]   Pregnancy History: [id number] Estimated Date of Delivery: 11/07/16 Estimated Gestational Age: 74w3dAttending: MGriffin Dakin MD  I met with Maria Fuentes because of sickle cell trait in the patient and the father of the pregnancy.   In summary:  Reviewed history of sickle cell trait  Hemoglobin electrophoresis identified hemoglobin AS (sickle cell trait) in Maria Fuentes   Patient reports father of the pregnancy also has sickle cell trait  Reviewed autosomal recessive inheritance and 25% chance for sickle cell disease in current pregnancy base on reported history  Provided patient with written resources regarding sickle cell disease  Discussed options of screening / testing  Amniocentesis- patient declined  Postnatal evaluation via newborn screening for sickle cell status-patient stated she prefers to await postnatal evaluation   Ultrasound performed today- results under separate cover  Discussed general population carrier screening options  CF-declined  SMA-declined  We began by reviewing both family histories in detail. Maria Fuentes carrier screening for sickle cell anemia in her previous pregnancy, which identified her to have sickle cell trait. Hemoglobin electrophoresis performed through her OB office document the presence of hemoglobin AS (Hb AS), also referred to as sickle cell trait. Maria Fuentes the father of the pregnancy has sickle cell trait, and his father died due to sickle cell disease. We do not have medical documentation confirming the sickle cell carrier status of the father of the pregnancy, or confirming if he carries a different hemoglobin variant.    We discussed that sickle cell affects the shape and function of the red blood cell by producing abnormal hemoglobin. They were counseled  that hemoglobin is a protein in the RBCs that carries oxygen to the body's organs. Individuals who have SCA have a changes within the genes that code for hemoglobin. Maria Fuentes counseled that sickle cell anemia, and other beta hemoglobinopathies are inherited in an autosomal recessive manner, and occur when both copies of the hemoglobin gene are changed and produce an abnormal hemoglobin S or other beta globin variant. Typically, one abnormal gene for the production of hemoglobin S is inherited from each parent. A carrier of SCA has one altered copy of the gene for hemoglobin and one typical working copy. Carriers of recessive conditions typically do not have symptoms related to the condition because they still have one functioning copy of the gene, and thus some production of the typical protein coded for by that gene.  We discussed that offspring for a carrier couple of an autosomal recessive condition have an independent chance for each of the following: 1) 1 in 4 (25%) chance to be unaffected, 2) 1 in 2 (50%) chance to be carrier, have sickle cell trait like the parents, and a 1 in 4 (25%) risk to be affected with sickle cell anemia. We discussed that when both parents are identified to be carrier with identified molecular change, prenatal diagnosis is available via amniocentesis. We reviewed risks, benefits, and limitations of amniocentesis including the associated 1 in 3629-476risk for complications, including spontaneous preterm labor and delivery. We also reviewed the [place] and [place] evaluation. Maria Fuentes amniocentesis for prenatal diagnosis of sickle cell anemia in the pregnancy. She stated that she preferred to await postnatal evaluation.   The family histories were otherwise found to be noncontributory for birth  defects, intellectual disability, and known genetic conditions. Without further information regarding the  provided family history, an accurate genetic risk cannot be calculated. Further [name] is warranted if more information is obtained.  Detailed ultrasound was performed today. Complete ultrasound results under separate cover.   Maria Fuentes was provided with written information regarding cystic fibrosis (CF), spinal muscular atrophy (SMA) and hemoglobinopathies including the carrier frequency, availability of carrier screening and prenatal diagnosis if indicated.  In addition, we discussed that CF and hemoglobinopathies are routinely screened for as part of the [place] newborn screening panel.  After further discussion, she declined screening for CF and SMA.  Maria Fuentes denied exposure to environmental toxins or chemical agents. She denied the use of alcohol, tobacco or street drugs. She denied significant viral illnesses during the course of her pregnancy. Her medical and surgical histories were noncontributory.   I counseled Maria Fuentes regarding the above risks and available options.  The approximate face-to-face time with the genetic counselor was 40 minutes.  Chipper Oman, MS Certified Genetic Counselor 07/28/2016

## 2016-08-01 ENCOUNTER — Other Ambulatory Visit (HOSPITAL_COMMUNITY): Payer: Self-pay

## 2016-08-28 ENCOUNTER — Encounter (HOSPITAL_COMMUNITY): Payer: Self-pay

## 2016-08-28 ENCOUNTER — Ambulatory Visit (HOSPITAL_COMMUNITY)
Admission: RE | Admit: 2016-08-28 | Discharge: 2016-08-28 | Disposition: A | Discharge status: Home / Self Care | Payer: Managed Care, Other (non HMO) | Source: Ambulatory Visit | Attending: Obstetrics and Gynecology | Admitting: Obstetrics and Gynecology

## 2016-08-28 DIAGNOSIS — Z3A29 29 weeks gestation of pregnancy: Secondary | ICD-10-CM | POA: Insufficient documentation

## 2016-08-28 DIAGNOSIS — Z862 Personal history of diseases of the blood and blood-forming organs and certain disorders involving the immune mechanism: Secondary | ICD-10-CM | POA: Diagnosis not present

## 2016-08-28 DIAGNOSIS — D573 Sickle-cell trait: Secondary | ICD-10-CM

## 2016-08-28 DIAGNOSIS — O34211 Maternal care for low transverse scar from previous cesarean delivery: Secondary | ICD-10-CM | POA: Diagnosis not present

## 2016-08-28 DIAGNOSIS — O409XX0 Polyhydramnios, unspecified trimester, not applicable or unspecified: Secondary | ICD-10-CM

## 2016-08-28 DIAGNOSIS — O99213 Obesity complicating pregnancy, third trimester: Secondary | ICD-10-CM | POA: Insufficient documentation

## 2016-09-15 ENCOUNTER — Other Ambulatory Visit: Payer: Self-pay | Admitting: Obstetrics and Gynecology

## 2016-10-13 ENCOUNTER — Other Ambulatory Visit: Payer: Self-pay | Admitting: Obstetrics & Gynecology

## 2016-10-13 LAB — OB RESULTS CONSOLE GBS: GBS: POSITIVE

## 2016-10-25 ENCOUNTER — Telehealth (HOSPITAL_COMMUNITY): Payer: Self-pay | Admitting: *Deleted

## 2016-10-25 NOTE — Telephone Encounter (Signed)
Preadmission screen  

## 2016-10-30 ENCOUNTER — Encounter (HOSPITAL_COMMUNITY): Payer: Self-pay

## 2016-11-02 ENCOUNTER — Encounter (HOSPITAL_COMMUNITY)
Admission: AD | Disposition: A | Discharge status: Home / Self Care | Payer: Self-pay | Source: Ambulatory Visit | Attending: Obstetrics

## 2016-11-02 ENCOUNTER — Encounter (HOSPITAL_COMMUNITY): Payer: Self-pay | Admitting: *Deleted

## 2016-11-02 ENCOUNTER — Inpatient Hospital Stay (HOSPITAL_COMMUNITY)
Admission: AD | Admit: 2016-11-02 | Discharge: 2016-11-05 | DRG: 765 | Disposition: A | Discharge status: Home / Self Care | Payer: Managed Care, Other (non HMO) | Source: Ambulatory Visit | Attending: Obstetrics | Admitting: Obstetrics

## 2016-11-02 ENCOUNTER — Inpatient Hospital Stay (HOSPITAL_COMMUNITY): Payer: Managed Care, Other (non HMO) | Admitting: Anesthesiology

## 2016-11-02 DIAGNOSIS — Z302 Encounter for sterilization: Secondary | ICD-10-CM | POA: Diagnosis not present

## 2016-11-02 DIAGNOSIS — O9902 Anemia complicating childbirth: Secondary | ICD-10-CM | POA: Diagnosis present

## 2016-11-02 DIAGNOSIS — O403XX0 Polyhydramnios, third trimester, not applicable or unspecified: Secondary | ICD-10-CM | POA: Diagnosis present

## 2016-11-02 DIAGNOSIS — O99214 Obesity complicating childbirth: Secondary | ICD-10-CM | POA: Diagnosis present

## 2016-11-02 DIAGNOSIS — O34211 Maternal care for low transverse scar from previous cesarean delivery: Secondary | ICD-10-CM | POA: Diagnosis present

## 2016-11-02 DIAGNOSIS — Z3A39 39 weeks gestation of pregnancy: Secondary | ICD-10-CM

## 2016-11-02 DIAGNOSIS — Z6841 Body Mass Index (BMI) 40.0 and over, adult: Secondary | ICD-10-CM | POA: Diagnosis not present

## 2016-11-02 DIAGNOSIS — O99824 Streptococcus B carrier state complicating childbirth: Secondary | ICD-10-CM | POA: Diagnosis present

## 2016-11-02 DIAGNOSIS — O409XX0 Polyhydramnios, unspecified trimester, not applicable or unspecified: Secondary | ICD-10-CM | POA: Diagnosis present

## 2016-11-02 DIAGNOSIS — D573 Sickle-cell trait: Secondary | ICD-10-CM

## 2016-11-02 DIAGNOSIS — O288 Other abnormal findings on antenatal screening of mother: Secondary | ICD-10-CM

## 2016-11-02 DIAGNOSIS — Z98891 History of uterine scar from previous surgery: Secondary | ICD-10-CM

## 2016-11-02 HISTORY — PX: TUBAL LIGATION: SHX77

## 2016-11-02 LAB — CBC
HCT: 33.6 % — ABNORMAL LOW (ref 36.0–46.0)
HEMOGLOBIN: 11.9 g/dL — AB (ref 12.0–15.0)
MCH: 28.5 pg (ref 26.0–34.0)
MCHC: 35.4 g/dL (ref 30.0–36.0)
MCV: 80.6 fL (ref 78.0–100.0)
PLATELETS: 243 10*3/uL (ref 150–400)
RBC: 4.17 MIL/uL (ref 3.87–5.11)
RDW: 14.9 % (ref 11.5–15.5)
WBC: 11.1 10*3/uL — ABNORMAL HIGH (ref 4.0–10.5)

## 2016-11-02 LAB — PREPARE RBC (CROSSMATCH)

## 2016-11-02 SURGERY — Surgical Case
Anesthesia: Monitor Anesthesia Care

## 2016-11-02 MED ORDER — MORPHINE SULFATE (PF) 0.5 MG/ML IJ SOLN
INTRAMUSCULAR | Status: AC
  Filled 2016-11-02: qty 10

## 2016-11-02 MED ORDER — SODIUM CHLORIDE 0.9 % IR SOLN
Status: DC | PRN
  Administered 2016-11-02: 1000 mL

## 2016-11-02 MED ORDER — KETOROLAC TROMETHAMINE 30 MG/ML IJ SOLN
30.0000 mg | Freq: Four times a day (QID) | INTRAMUSCULAR | Status: AC | PRN
  Administered 2016-11-02: 30 mg via INTRAMUSCULAR

## 2016-11-02 MED ORDER — FENTANYL CITRATE (PF) 100 MCG/2ML IJ SOLN
INTRAMUSCULAR | Status: DC | PRN
  Administered 2016-11-02: 10 ug via INTRAVENOUS

## 2016-11-02 MED ORDER — LACTATED RINGERS IV SOLN
INTRAVENOUS | Status: DC
  Administered 2016-11-02 (×3): via INTRAVENOUS

## 2016-11-02 MED ORDER — SCOPOLAMINE 1 MG/3DAYS TD PT72
MEDICATED_PATCH | TRANSDERMAL | Status: DC | PRN
  Administered 2016-11-02: 1 via TRANSDERMAL

## 2016-11-02 MED ORDER — KETOROLAC TROMETHAMINE 30 MG/ML IJ SOLN: 30.0000 mg | Freq: Four times a day (QID) | INTRAMUSCULAR | Status: AC | PRN

## 2016-11-02 MED ORDER — KETOROLAC TROMETHAMINE 30 MG/ML IJ SOLN
INTRAMUSCULAR | Status: AC
  Filled 2016-11-02: qty 1

## 2016-11-02 MED ORDER — FENTANYL CITRATE (PF) 100 MCG/2ML IJ SOLN
INTRAMUSCULAR | Status: AC
  Filled 2016-11-02: qty 2

## 2016-11-02 MED ORDER — SOD CITRATE-CITRIC ACID 500-334 MG/5ML PO SOLN
ORAL | Status: AC
  Administered 2016-11-02: 30 mL
  Filled 2016-11-02: qty 15

## 2016-11-02 MED ORDER — SCOPOLAMINE 1 MG/3DAYS TD PT72
MEDICATED_PATCH | TRANSDERMAL | Status: AC
  Filled 2016-11-02: qty 1

## 2016-11-02 MED ORDER — OXYTOCIN 10 UNIT/ML IJ SOLN
INTRAVENOUS | Status: DC | PRN
  Administered 2016-11-02: 40 [IU] via INTRAVENOUS

## 2016-11-02 MED ORDER — ONDANSETRON HCL 4 MG/2ML IJ SOLN
INTRAMUSCULAR | Status: AC
  Filled 2016-11-02: qty 2

## 2016-11-02 MED ORDER — DEXAMETHASONE SODIUM PHOSPHATE 4 MG/ML IJ SOLN
INTRAMUSCULAR | Status: DC | PRN
  Administered 2016-11-02: 4 mg via INTRAVENOUS

## 2016-11-02 MED ORDER — BUPIVACAINE IN DEXTROSE 0.75-8.25 % IT SOLN
INTRATHECAL | Status: DC | PRN
  Administered 2016-11-02: 1.6 mL via INTRATHECAL

## 2016-11-02 MED ORDER — CEFAZOLIN SODIUM-DEXTROSE 2-4 GM/100ML-% IV SOLN: 2.0000 g | Freq: Once | INTRAVENOUS | Status: DC

## 2016-11-02 MED ORDER — PHENYLEPHRINE HCL 10 MG/ML IJ SOLN
INTRAMUSCULAR | Status: DC | PRN
  Administered 2016-11-02: 80 ug via INTRAVENOUS
  Administered 2016-11-02: 120 ug via INTRAVENOUS
  Administered 2016-11-02 (×2): 80 ug via INTRAVENOUS
  Administered 2016-11-02: 60 ug via INTRAVENOUS
  Administered 2016-11-02: 120 ug via INTRAVENOUS
  Administered 2016-11-02: 80 ug via INTRAVENOUS
  Administered 2016-11-02: 40 ug via INTRAVENOUS

## 2016-11-02 MED ORDER — MORPHINE SULFATE (PF) 0.5 MG/ML IJ SOLN
INTRAMUSCULAR | Status: DC | PRN
  Administered 2016-11-02: .2 mg via EPIDURAL

## 2016-11-02 MED ORDER — CEFAZOLIN SODIUM 10 G IJ SOLR
3.0000 g | Freq: Once | INTRAMUSCULAR | Status: AC
  Administered 2016-11-02: 3 g via INTRAVENOUS
  Filled 2016-11-02: qty 3000

## 2016-11-02 MED ORDER — PHENYLEPHRINE 8 MG IN D5W 100 ML (0.08MG/ML) PREMIX OPTIME
INJECTION | INTRAVENOUS | Status: DC | PRN
  Administered 2016-11-02: 80 ug/min via INTRAVENOUS

## 2016-11-02 MED ORDER — ONDANSETRON HCL 4 MG/2ML IJ SOLN
INTRAMUSCULAR | Status: DC | PRN
  Administered 2016-11-02: 4 mg via INTRAVENOUS

## 2016-11-02 MED ORDER — DEXAMETHASONE SODIUM PHOSPHATE 4 MG/ML IJ SOLN
INTRAMUSCULAR | Status: AC
  Filled 2016-11-02: qty 1

## 2016-11-02 MED ORDER — OXYTOCIN 10 UNIT/ML IJ SOLN
INTRAMUSCULAR | Status: AC
  Filled 2016-11-02: qty 4

## 2016-11-02 SURGICAL SUPPLY — 37 items
BENZOIN TINCTURE PRP APPL 2/3 (GAUZE/BANDAGES/DRESSINGS) ×3 IMPLANT
CHLORAPREP W/TINT 26ML (MISCELLANEOUS) ×3 IMPLANT
CLAMP CORD UMBIL (MISCELLANEOUS) IMPLANT
CLOSURE STERI STRIP 1/2 X4 (GAUZE/BANDAGES/DRESSINGS) ×2 IMPLANT
CLOSURE WOUND 1/2 X4 (GAUZE/BANDAGES/DRESSINGS) ×1
CLOTH BEACON ORANGE TIMEOUT ST (SAFETY) ×3 IMPLANT
DRSG OPSITE POSTOP 4X10 (GAUZE/BANDAGES/DRESSINGS) ×3 IMPLANT
ELECT REM PT RETURN 9FT ADLT (ELECTROSURGICAL) ×3
ELECTRODE REM PT RTRN 9FT ADLT (ELECTROSURGICAL) ×1 IMPLANT
EXTRACTOR VACUUM KIWI (MISCELLANEOUS) IMPLANT
GLOVE BIO SURGEON STRL SZ 6 (GLOVE) ×3 IMPLANT
GLOVE BIOGEL PI IND STRL 6.5 (GLOVE) ×1 IMPLANT
GLOVE BIOGEL PI IND STRL 7.0 (GLOVE) ×1 IMPLANT
GLOVE BIOGEL PI INDICATOR 6.5 (GLOVE) ×2
GLOVE BIOGEL PI INDICATOR 7.0 (GLOVE) ×2
GOWN STRL REUS W/TWL LRG LVL3 (GOWN DISPOSABLE) ×6 IMPLANT
KIT ABG SYR 3ML LUER SLIP (SYRINGE) IMPLANT
NEEDLE HYPO 25X5/8 SAFETYGLIDE (NEEDLE) IMPLANT
NS IRRIG 1000ML POUR BTL (IV SOLUTION) ×3 IMPLANT
PACK C SECTION WH (CUSTOM PROCEDURE TRAY) ×3 IMPLANT
PAD ABD 7.5X8 STRL (GAUZE/BANDAGES/DRESSINGS) ×3 IMPLANT
PAD OB MATERNITY 4.3X12.25 (PERSONAL CARE ITEMS) ×3 IMPLANT
PENCIL SMOKE EVAC W/HOLSTER (ELECTROSURGICAL) ×3 IMPLANT
RTRCTR C-SECT PINK 25CM LRG (MISCELLANEOUS) ×3 IMPLANT
STRIP CLOSURE SKIN 1/2X4 (GAUZE/BANDAGES/DRESSINGS) ×2 IMPLANT
SUT MNCRL 0 VIOLET CTX 36 (SUTURE) ×2 IMPLANT
SUT MNCRL AB 3-0 PS2 27 (SUTURE) ×3 IMPLANT
SUT MONOCRYL 0 CTX 36 (SUTURE) ×4
SUT PLAIN 0 NONE (SUTURE) IMPLANT
SUT PLAIN 2 0 (SUTURE) ×2
SUT PLAIN ABS 2-0 CT1 27XMFL (SUTURE) ×1 IMPLANT
SUT VIC AB 0 CTX 36 (SUTURE) ×4
SUT VIC AB 0 CTX36XBRD ANBCTRL (SUTURE) ×2 IMPLANT
SUT VIC AB 2-0 CT1 27 (SUTURE) ×2
SUT VIC AB 2-0 CT1 TAPERPNT 27 (SUTURE) ×1 IMPLANT
TOWEL OR 17X24 6PK STRL BLUE (TOWEL DISPOSABLE) ×3 IMPLANT
TRAY FOLEY CATH SILVER 14FR (SET/KITS/TRAYS/PACK) ×3 IMPLANT

## 2016-11-02 NOTE — Anesthesia Postprocedure Evaluation (Signed)
Anesthesia Post Note  Patient: [name]  Procedure(s) Performed: Procedure(s) (LRB): CESAREAN SECTION (N/A) BILATERAL TUBAL LIGATION (N/A)  Patient location during evaluation: PACU Anesthesia Type: MAC and Epidural Level of consciousness: awake and alert Pain management: pain level controlled Vital Signs Assessment: post-procedure vital signs reviewed and stable Respiratory status: spontaneous breathing and respiratory function stable Cardiovascular status: blood pressure returned to baseline and stable Postop Assessment: epidural receding Anesthetic complications: no        Last Vitals:  Vitals:   11/02/16 2312 11/02/16 2313  BP:    Pulse: 96 95  Resp: 16 (!) 22  Temp:      Last Pain:  Vitals:   11/02/16 2258  TempSrc: Axillary  PainSc:    Pain Goal:                 Maria Fuentes DANIEL

## 2016-11-02 NOTE — Anesthesia Procedure Notes (Signed)
Spinal  Patient location during procedure: OR Start time: 11/02/2016 8:21 PM End time: 11/02/2016 8:29 PM Staffing Anesthesiologist: Heather RobertsSINGER, Dezzie Badilla Performed: anesthesiologist  Preanesthetic Checklist Completed: patient identified, surgical consent, pre-op evaluation, timeout performed, IV checked, risks and benefits discussed and monitors and equipment checked Spinal Block Patient position: sitting Prep: DuraPrep Patient monitoring: cardiac monitor, continuous pulse ox and blood pressure Approach: midline Location: L2-3 Injection technique: single-shot Needle Needle type: Pencan  Needle gauge: 24 G Needle length: 9 cm Additional Notes Functioning IV was confirmed and monitors were applied. Sterile prep and drape, including hand hygiene and sterile gloves were used. The patient was positioned and the spine was prepped. The skin was anesthetized with lidocaine.  Free flow of clear CSF was obtained prior to injecting local anesthetic into the CSF.  The spinal needle aspirated freely following injection.  The needle was carefully withdrawn.  The patient tolerated the procedure well.

## 2016-11-02 NOTE — Anesthesia Preprocedure Evaluation (Signed)
Anesthesia Evaluation  Patient identified by MRN, date of birth, ID band Patient awake    Reviewed: Allergy & Precautions, H&P , NPO status , Patient's Chart, lab work & pertinent test results, reviewed documented beta blocker date and time   Airway Mallampati: II  TM Distance: >3 FB Neck ROM: full    Dental no notable dental hx. (+) Dental Advisory Given   Pulmonary neg pulmonary ROS,    Pulmonary exam normal breath sounds clear to auscultation       Cardiovascular Exercise Tolerance: Good negative cardio ROS   Rhythm:regular Rate:Normal     Neuro/Psych negative neurological ROS  negative psych ROS   GI/Hepatic negative GI ROS, Neg liver ROS,   Endo/Other  Morbid obesity  Renal/GU negative Renal ROS  negative genitourinary   Musculoskeletal   Abdominal   Peds  Hematology negative hematology ROS (+)   Anesthesia Other Findings   Reproductive/Obstetrics negative OB ROS                             Anesthesia Physical Anesthesia Plan  ASA: II  Anesthesia Plan: MAC and Spinal   Post-op Pain Management:    Induction:   Airway Management Planned: Natural Airway and Simple Face Mask  Additional Equipment:   Intra-op Plan:   Post-operative Plan:   Informed Consent: I have reviewed the patients History and Physical, chart, labs and discussed the procedure including the risks, benefits and alternatives for the proposed anesthesia with the patient or authorized representative who has indicated his/her understanding and acceptance.   Dental Advisory Given  Plan Discussed with: CRNA, Anesthesiologist and Surgeon  Anesthesia Plan Comments:         Anesthesia Quick Evaluation

## 2016-11-02 NOTE — H&P (Signed)
31 y.o. G3P1011 @ 4624w0d presents for RCS for equivocal fetal testing at term.  She has known polyhydramnios and had a 6/8 BPP in the office during her routine antenatal testing (-2 for breathing).  She then had a non-reactive NST with a small variable deceleration .  Since arrival to the hospital, she reports occasional contractions. Otherwise has good fetal movement and no bleeding.  Pregnancy c/b:  1. History CD for arrest of dilation with G1, desires RCS 2. Polyhydramnios: AFI 33 (DVP 13) today.  Passed  3.  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  No dilated ducts seen in the liver 4. Sickle trait:  FOB also w sickle trait.  Declined fetal diagnosis  Past Medical History:  Diagnosis Date  . H/O varicella   . Sickle cell trait [facility](HCC)     Past Surgical History:  Procedure Laterality Date  . CESAREAN SECTION  08/16/2012   Procedure: CESAREAN SECTION;  Surgeon: Freddrick MarchKendra H. Tenny Crawoss, MD;  Location: [facility] ORS;  Service: Obstetrics;  Laterality: N/A;  . DILATION AND CURETTAGE OF UTERUS    . THERAPEUTIC ABORTION    . WISDOM TOOTH EXTRACTION      OB History  Gravida Para Term Preterm AB Living  3 1 1   1 1   SAB TAB Ectopic Multiple Live Births    1     1    # Outcome Date GA Lbr Len/2nd Weight Sex Delivery Anes PTL Lv  3 Current           2 Term 08/16/12 3852w1d   M CS-LTranv EPI  LIV  1 TAB               Social History   Social History  . Marital status: Single    Spouse name: N/A  . Number of children: N/A  . Years of education: N/A   Occupational History  . Not on file.   Social History Main Topics  . Smoking status: Never Smoker  . Smokeless tobacco: Never Used  . Alcohol use No  . Drug use: No  . Sexual activity: Yes    Birth control/ protection: None   Other Topics Concern  . Not on file   Social History Narrative  . No narrative on file   Patient has no known allergies.    Prenatal Transfer Tool  Maternal Diabetes: No Genetic Screening: Normal Maternal  Ultrasounds/Referrals: Abnormal:  Findings:   Other:  Adenomyosis of gallbladder on US today Fetal Ultrasounds or other Referrals:  Referred to Materal Fetal Medicine  for polyhydramnios Maternal Substance Abuse:  No Significant Maternal Medications:  None Significant Maternal Lab Results: Lab values include: Group B Strep positive  ABO, Rh: --/--/A POS (07/24 1958) Antibody:  Negative Rubella:  Immune RPR:   NR HBsAg:   Negative HIV:   Negative GBS: Positive (02/02 0000)       Vitals:   11/02/16 1625 11/02/16 1635  BP:  (!) 102/58  Pulse:  98  Resp: 20   Temp: 98.6 F (37 C)      General:  NAD Abdomen:  soft, gravid, EFW 9.5# FHTs:  130s, mod var, now with accelerations, but occ variable decels Toco:  q5-7 min  US 2/13: 9lb 2oz (>90%)  A/P   31 y.o. G3P1011 5024w0d presents for RCS and BTL due to equivocal fetal testing at term BPP 6/10 in office this afternoon.  NST now reactive, but intermittent variable decelerations persist, though mild.  Recommend proceeding with  RCS now.  Patient also desires bilateral tubal ligation (this is not documented in her prenatal chart, but she reports multiple discussions with multiple physicians over the course of her prenatal care discussing btl).  Discussed risks to include: infection, bleeding, damage to surrounding structures (including but not limited to bladder, bowel, tubes, ovaries, nerves, vessels, baby), vte, need for blood transfusion, risk of failure of btl, risk of regret, risk of subsequent ectopic pregnancy, need for additional procedures SCDs 3gm ancef on call to OR.    FSR/ vtx/ GBS positive  Khloei Spiker GEFFEL Deanthony Maull

## 2016-11-02 NOTE — Op Note (Signed)
Cesarean Section Procedure Note  Pre-operative Diagnosis: 1. Intrauterine pregnancy at 10531w0d  2. Polyhydramnios  3. Equivocal fetal testing at term  4. Undesired fertility  Post-operative Diagnosis: 1. Intrauterine pregnancy at 7331w0d  2. Polyhydramnios  3. Equivocal fetal testing at term  4. Undesired fertility  Surgeon: Marlow Baarsyanna Shadai Mcclane, MD  Procedure: Repeat low transverse cesarean section with parkland bilateral tubal ligation  Anesthesia: Spinal anesthesia  Estimated Blood Loss: 700 mL         Drains: Foley catheter         Specimens: portions of bilateral tubes to pathology         Complications:  None; patient tolerated the procedure well.         Disposition: PACU - hemodynamically stable.  Findings:  Normal uterus, tubes and ovaries bilaterally.  Viable female infant, 4035g (8lb 14oz) Apgars 8, 9.    Procedure Details   The patient was counseled about the risks, benefits, complications of the cesarean section as well as the permanent nature of a tubal ligation.  She elected to proceed with a tubal ligation. Consent was obtained.    After spinal anesthesia was found to adequate , the patient was placed in the dorsal supine position with a leftward tilt, draped and prepped in the usual sterile manner. A Pfannenstiel incision was made and carried down through the subcutaneous tissue to the fascia.  The fascia was incised in the midline and the fascial incision was extended laterally with Mayo scissors. The superior aspect of the fascial incision was grasped with two Kocher clamp, tented up and the rectus muscles dissected off bluntly. The rectus was then dissected off with blunt dissection and Mayo scissors inferiorly. The rectus muscles were separated in the midline. The abdominal peritoneum was identified, tented up, entered sharply, and the incision was extended superiorly and inferiorly with good visualization of the bladder.  The Alexis retractor was deployed.   The vesicouterine  peritoneum was identified and noted to be well below the level of the hysterotomy.   A scalpel was then used to make a low transverse incision on the uterus which was extended laterally with blunt dissection. The fluid was clear and a copious amount. The fetal vertex was identified and floating.  It was not easily delivered, so a kiwi vacuum was placed.  The fetal head was then gently delivered, followed by shoulders and body.  A nuchal and body cord was identified.  A sixty second delayed cord clamping was performed via hospital protocol.  The cord was clamped and cut and the infant was passed to the waiting neonatologist.  Placenta was then delivered spontaneously, intact and appear normal, the uterus was cleared of all clot and debris.   The hysterotomy was repaired with #0 Monocryl in running locked fashion.  An additional figure of eight suture was placed for hemostasis.  At this time, attention was turned to the left fallopian tube.  The tube was grasped with two babcock clamps.  An avascular segment of the mesosalpinx was identified, and a parkland tubal ligation was performed in the usual fashion with #0 plain gut suture.   Tubal ostia were identified on both remaining sides of the tube.  This was repeated with the right fallopian tube. The tubes were doubly tied bilaterally.  The portion of the left and right fallopian tubes were sent to pathology.   The serosal edges of the incision were oozy, and bovie cautery was used to achieve hemostasis.  The hysterotomy was reexamined  and excellent hemostasis was noted.  The abdominal cavity was cleared of all clot and debris.  The Alexis retractor was removed from the abdomen. The fascia and rectus muscles were inspected and were hemostatic. The left aspect of the peritoneum was retracted laterally and not easily brought to the midline; therefore, the peritoneum was not closed.  The fascia was closed with 0 Vicryl in a running fashion. The subcuticular layer was  irrigated and all bleeders cauterized.  The subcutaneous layer was re approximated with interrupted 3-0 plain gut.  The skin was closed with 3-0 monocryl in a subcuticular fashion. The incision was dressed with benzoine, steri strips and pressure dressing. All sponge lap and needle counts were correct x3. Patient tolerated the procedure well and recovered in stable condition following the procedure.

## 2016-11-02 NOTE — Transfer of Care (Signed)
Immediate Anesthesia Transfer of Care Note  Patient: Maria Fuentes  Procedure(s) Performed: Procedure(s): CESAREAN SECTION (N/A) BILATERAL TUBAL LIGATION (N/A)  Patient Location: PACU  Anesthesia Type:Spinal  Level of Consciousness: awake, alert  and oriented  Airway & Oxygen Therapy: Patient Spontanous Breathing  Post-op Assessment: Report given to RN and Post -op Vital signs reviewed and stable  Post vital signs: Reviewed and stable  Last Vitals:  Vitals:   11/02/16 1625 11/02/16 1635  BP:  (!) 102/58  Pulse:  98  Resp: 20   Temp: 37 C     Last Pain:  Vitals:   11/02/16 1625  TempSrc: Oral  PainSc: 0-No pain         Complications: No apparent anesthesia complications

## 2016-11-02 NOTE — Brief Op Note (Signed)
11/02/2016  9:33 PM  PATIENT:  Maria Fuentes  31 y.o. female  PRE-OPERATIVE DIAGNOSIS:  Prior cesaren section, equivocal fetal testing and polyhydramnios  POST-OPERATIVE DIAGNOSIS:  Prior cesaren section, equivocal fetal testing and polyhydramnios  PROCEDURE:  Procedure(s): CESAREAN SECTION (N/A) BILATERAL TUBAL LIGATION (N/A)  SURGEON:  Surgeon(s) and Role:    * Marlow Baarsyanna Mohsin Crum, MD - Primary  ANESTHESIA:   spinal  EBL:  Total I/O In: 2000 [I.V.:2000] Out: 700 [Blood:700]  BLOOD ADMINISTERED:none  DRAINS: none   LOCAL MEDICATIONS USED:  NONE  SPECIMEN:  Source of Specimen:  portion of bilateral tubes  DISPOSITION OF SPECIMEN:  PATHOLOGY  COUNTS:  YES  TOURNIQUET:  * No tourniquets in log *  DICTATION: .Note written in EPIC  PLAN OF CARE: Admit to inpatient   PATIENT DISPOSITION:  PACU - hemodynamically stable.   Delay start of Pharmacological VTE agent (>24hrs) due to surgical blood loss or risk of bleeding: not applicable

## 2016-11-03 ENCOUNTER — Encounter (HOSPITAL_COMMUNITY): Payer: Self-pay | Admitting: Obstetrics

## 2016-11-03 DIAGNOSIS — O409XX0 Polyhydramnios, unspecified trimester, not applicable or unspecified: Secondary | ICD-10-CM | POA: Diagnosis present

## 2016-11-03 LAB — CBC
HEMATOCRIT: 32.9 % — AB (ref 36.0–46.0)
Hemoglobin: 11.8 g/dL — ABNORMAL LOW (ref 12.0–15.0)
MCH: 28.9 pg (ref 26.0–34.0)
MCHC: 35.9 g/dL (ref 30.0–36.0)
MCV: 80.4 fL (ref 78.0–100.0)
Platelets: 225 10*3/uL (ref 150–400)
RBC: 4.09 MIL/uL (ref 3.87–5.11)
RDW: 14.8 % (ref 11.5–15.5)
WBC: 16.6 10*3/uL — ABNORMAL HIGH (ref 4.0–10.5)

## 2016-11-03 LAB — RPR: RPR Ser Ql: NONREACTIVE

## 2016-11-03 MED ORDER — NALOXONE HCL 2 MG/2ML IJ SOSY
1.0000 ug/kg/h | PREFILLED_SYRINGE | INTRAVENOUS | Status: DC | PRN
  Filled 2016-11-03: qty 2

## 2016-11-03 MED ORDER — NALBUPHINE HCL 10 MG/ML IJ SOLN: 5.0000 mg | INTRAMUSCULAR | Status: DC | PRN

## 2016-11-03 MED ORDER — NALOXONE HCL 0.4 MG/ML IJ SOLN: 0.4000 mg | INTRAMUSCULAR | Status: DC | PRN

## 2016-11-03 MED ORDER — OXYCODONE HCL 5 MG PO TABS: 5.0000 mg | ORAL_TABLET | ORAL | Status: DC | PRN

## 2016-11-03 MED ORDER — OXYCODONE HCL 5 MG PO TABS: 10.0000 mg | ORAL_TABLET | ORAL | Status: DC | PRN

## 2016-11-03 MED ORDER — SCOPOLAMINE 1 MG/3DAYS TD PT72
1.0000 | MEDICATED_PATCH | Freq: Once | TRANSDERMAL | Status: DC
  Filled 2016-11-03: qty 1

## 2016-11-03 MED ORDER — IBUPROFEN 600 MG PO TABS
600.0000 mg | ORAL_TABLET | Freq: Four times a day (QID) | ORAL | Status: DC
  Administered 2016-11-03 – 2016-11-05 (×8): 600 mg via ORAL
  Filled 2016-11-03 (×8): qty 1

## 2016-11-03 MED ORDER — PRENATAL MULTIVITAMIN CH
1.0000 | ORAL_TABLET | Freq: Every day | ORAL | Status: DC
  Administered 2016-11-03 – 2016-11-04 (×2): 1 via ORAL
  Filled 2016-11-03 (×2): qty 1

## 2016-11-03 MED ORDER — HYDROMORPHONE HCL 1 MG/ML IJ SOLN: 0.2500 mg | INTRAMUSCULAR | Status: DC | PRN

## 2016-11-03 MED ORDER — WITCH HAZEL-GLYCERIN EX PADS: 1.0000 "application " | MEDICATED_PAD | CUTANEOUS | Status: DC | PRN

## 2016-11-03 MED ORDER — DIPHENHYDRAMINE HCL 25 MG PO CAPS: 25.0000 mg | ORAL_CAPSULE | ORAL | Status: DC | PRN

## 2016-11-03 MED ORDER — ONDANSETRON HCL 4 MG/2ML IJ SOLN: 4.0000 mg | Freq: Three times a day (TID) | INTRAMUSCULAR | Status: DC | PRN

## 2016-11-03 MED ORDER — LACTATED RINGERS IV SOLN
INTRAVENOUS | Status: DC
  Administered 2016-11-03: 06:00:00 via INTRAVENOUS

## 2016-11-03 MED ORDER — TETANUS-DIPHTH-ACELL PERTUSSIS 5-2.5-18.5 LF-MCG/0.5 IM SUSP: 0.5000 mL | Freq: Once | INTRAMUSCULAR | Status: DC

## 2016-11-03 MED ORDER — OXYTOCIN 40 UNITS IN LACTATED RINGERS INFUSION - SIMPLE MED: 2.5000 [IU]/h | INTRAVENOUS | Status: AC

## 2016-11-03 MED ORDER — SIMETHICONE 80 MG PO CHEW
80.0000 mg | CHEWABLE_TABLET | Freq: Three times a day (TID) | ORAL | Status: DC
  Administered 2016-11-03 – 2016-11-05 (×6): 80 mg via ORAL
  Filled 2016-11-03 (×6): qty 1

## 2016-11-03 MED ORDER — DIBUCAINE 1 % RE OINT: 1.0000 "application " | TOPICAL_OINTMENT | RECTAL | Status: DC | PRN

## 2016-11-03 MED ORDER — SIMETHICONE 80 MG PO CHEW
80.0000 mg | CHEWABLE_TABLET | ORAL | Status: DC
  Administered 2016-11-03 – 2016-11-04 (×2): 80 mg via ORAL
  Filled 2016-11-03 (×2): qty 1

## 2016-11-03 MED ORDER — ACETAMINOPHEN 325 MG PO TABS: 650.0000 mg | ORAL_TABLET | ORAL | Status: DC | PRN

## 2016-11-03 MED ORDER — SIMETHICONE 80 MG PO CHEW: 80.0000 mg | CHEWABLE_TABLET | ORAL | Status: DC | PRN

## 2016-11-03 MED ORDER — MEPERIDINE HCL 25 MG/ML IJ SOLN: 6.2500 mg | INTRAMUSCULAR | Status: DC | PRN

## 2016-11-03 MED ORDER — NALBUPHINE HCL 10 MG/ML IJ SOLN: 5.0000 mg | Freq: Once | INTRAMUSCULAR | Status: DC | PRN

## 2016-11-03 MED ORDER — MENTHOL 3 MG MT LOZG: 1.0000 | LOZENGE | OROMUCOSAL | Status: DC | PRN

## 2016-11-03 MED ORDER — DIPHENHYDRAMINE HCL 25 MG PO CAPS: 25.0000 mg | ORAL_CAPSULE | Freq: Four times a day (QID) | ORAL | Status: DC | PRN

## 2016-11-03 MED ORDER — COCONUT OIL OIL: 1.0000 "application " | TOPICAL_OIL | Status: DC | PRN

## 2016-11-03 MED ORDER — SODIUM CHLORIDE 0.9% FLUSH: 3.0000 mL | INTRAVENOUS | Status: DC | PRN

## 2016-11-03 MED ORDER — PROMETHAZINE HCL 25 MG/ML IJ SOLN: 6.2500 mg | INTRAMUSCULAR | Status: DC | PRN

## 2016-11-03 MED ORDER — DIPHENHYDRAMINE HCL 50 MG/ML IJ SOLN: 12.5000 mg | INTRAMUSCULAR | Status: DC | PRN

## 2016-11-03 MED ORDER — SENNOSIDES-DOCUSATE SODIUM 8.6-50 MG PO TABS
2.0000 | ORAL_TABLET | ORAL | Status: DC
  Administered 2016-11-03 – 2016-11-04 (×2): 2 via ORAL
  Filled 2016-11-03 (×2): qty 2

## 2016-11-03 NOTE — Lactation Note (Signed)
This note was copied from a baby's chart. Lactation Consultation Note RN informed LC that mom does not want to see LC. Mom told RN she had BF before and didn't want LC to consult. Mom is breast/formula. Patient Name: Maria Fuentes [id number]'s Date: 11/03/2016     Maternal Data    Feeding    LATCH Score/Interventions                      Lactation Tools Discussed/Used     Consult Status      [name], [name] G 11/03/2016, 5:20 AM

## 2016-11-03 NOTE — Addendum Note (Signed)
Addendum  created 11/03/16 [id number] by Angela Adamana G Doneisha Ivey, CRNA   Sign clinical note

## 2016-11-03 NOTE — Anesthesia Postprocedure Evaluation (Addendum)
Anesthesia Post Note  Patient: [name]  Procedure(s) Performed: Procedure(s) (LRB): CESAREAN SECTION (N/A) BILATERAL TUBAL LIGATION (N/A)  Patient location during evaluation: Mother Baby Anesthesia Type: MAC Level of consciousness: awake and alert, oriented and patient cooperative Pain management: pain level controlled Vital Signs Assessment: post-procedure vital signs reviewed and stable Respiratory status: spontaneous breathing Cardiovascular status: stable Postop Assessment: no headache, patient able to bend at knees, no signs of nausea or vomiting and spinal receding Anesthetic complications: no Comments: Pain score 0.        Last Vitals:  Vitals:   11/03/16 0100 11/03/16 0255  BP: (!) 105/52 98/60  Pulse: 99 98  Resp: 19 19  Temp: 36.7 C 36.6 C    Last Pain:  Vitals:   11/03/16 0610  TempSrc:   PainSc: 0-No pain   Pain Goal:                 [facility]

## 2016-11-03 NOTE — Psychosocial Assessment (Signed)
MOB was insistent that she wants baby to have a bottle. I explained to MOB that this may cause nipple confusion for baby. MOB stated that he was to sleepy to latch to her breast in the PACU and she wanted him to have a bottle. I explained to MOB that it was normal for baby to be sleepy and not latch well at first, as this is a learning experience for both MOB and baby. I asked MOB if she wanted to see LC and she stated no, that, this was not her first time with breast feeding and she was fine, she just wants baby to have a bottle.

## 2016-11-03 NOTE — Progress Notes (Signed)
POD#1 Pt has no complaints Lochia mild VSSAF'' IMP/ Stable Plan/ routine care.

## 2016-11-04 LAB — BIRTH TISSUE RECOVERY COLLECTION (PLACENTA DONATION)

## 2016-11-04 NOTE — Progress Notes (Signed)
POD#2 Pt without complaints. Does not want to go home today VSSAF IMP/ stable Plan/ Routine care

## 2016-11-05 MED ORDER — OXYCODONE HCL 5 MG PO TABS: 5.0000 mg | ORAL_TABLET | ORAL | 0 refills | Status: DC | PRN

## 2016-11-05 MED ORDER — IBUPROFEN 600 MG PO TABS: 600.0000 mg | ORAL_TABLET | Freq: Four times a day (QID) | ORAL | 0 refills | Status: AC

## 2016-11-05 NOTE — Discharge Summary (Signed)
Obstetric Discharge Summary Reason for Admission: cesarean section Prenatal Procedures: NST and ultrasound Intrapartum Procedures: cesarean: low cervical, transverse and tubal ligation Postpartum Procedures: none Complications-Operative and Postpartum: none Hemoglobin  Date Value Ref Range Status  11/03/2016 11.8 (L) 12.0 - 15.0 g/dL Final   HCT  Date Value Ref Range Status  11/03/2016 32.9 (L) 36.0 - 46.0 % Final    Physical Exam:  General: alert and cooperative Lochia: appropriate Uterine Fundus: firm Incision: healing well DVT Evaluation: No evidence of DVT seen on physical exam.  Discharge Diagnoses: Term Pregnancy-delivered and prior C/S, declines VBAC and non reassuring FHTs and desires BTL  Discharge Information: Date: 11/05/2016 Activity: pelvic rest Diet: routine Medications: PNV, Ibuprofen and Percocet Condition: stable Instructions: refer to practice specific booklet Discharge to: home Follow-up Information    [facility]. Schedule an appointment as soon as possible for a visit in 1 month(s).   Specialty:  Obstetrics Contact information: [address] [place] [id number] [phone]           Newborn Data: Live born female  Birth Weight: 8 lb 14.3 oz (4035 g) APGAR: 8, 9  Home with mother.  ANDERSON,MARK E 11/05/2016, 7:52 AM

## 2016-11-06 LAB — TYPE AND SCREEN
Blood Product Expiration Date: 201803142359
Blood Product Expiration Date: 201803202359
ISSUE DATE / TIME: 201802200745
UNIT TYPE AND RH: 6200
UNIT TYPE AND RH: 6200

## 2016-11-07 ENCOUNTER — Encounter (HOSPITAL_COMMUNITY)
Admission: RE | Admit: 2016-11-07 | Discharge: 2016-11-07 | Disposition: A | Discharge status: Home / Self Care | Payer: Managed Care, Other (non HMO) | Source: Ambulatory Visit

## 2016-11-08 ENCOUNTER — Inpatient Hospital Stay (HOSPITAL_COMMUNITY)
Admission: RE | Admit: 2016-11-08 | Payer: Managed Care, Other (non HMO) | Source: Ambulatory Visit | Admitting: Obstetrics and Gynecology

## 2016-11-08 ENCOUNTER — Encounter (HOSPITAL_COMMUNITY): Admission: RE | Payer: Self-pay | Source: Ambulatory Visit

## 2016-11-08 SURGERY — Surgical Case
Anesthesia: Regional

## 2017-02-10 ENCOUNTER — Encounter (HOSPITAL_COMMUNITY): Payer: Self-pay | Admitting: Obstetrics

## 2017-02-10 NOTE — Addendum Note (Signed)
Addendum  created 02/10/17 0954 by Makenlee Mckeag, MD   Sign clinical note    

## 2018-01-29 IMAGING — US US OB TRANSVAGINAL
1 series · 15 of 28 positions shown · non-contrast
Comparison: None.

CLINICAL DATA: Patient reportedly pregnant. Quantitative beta HCG
level is pending. Estimated gestational age is 5 weeks and 4 days
based on the last menstrual period.

EXAM:
OBSTETRIC <14 WK US AND TRANSVAGINAL OB US
TECHNIQUE: Both transabdominal and transvaginal ultrasound examinations were
performed for complete evaluation of the gestation as well as the
maternal uterus, adnexal regions, and pelvic cul-de-sac.
Transvaginal technique was performed to assess early pregnancy.

[Series 1: us ob transvaginal · 15 of 55 slices shown]
[im 1/55]
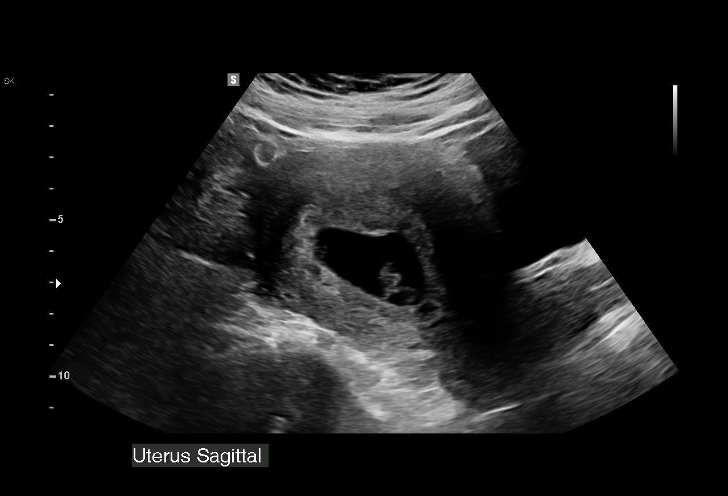
[im 5/55]
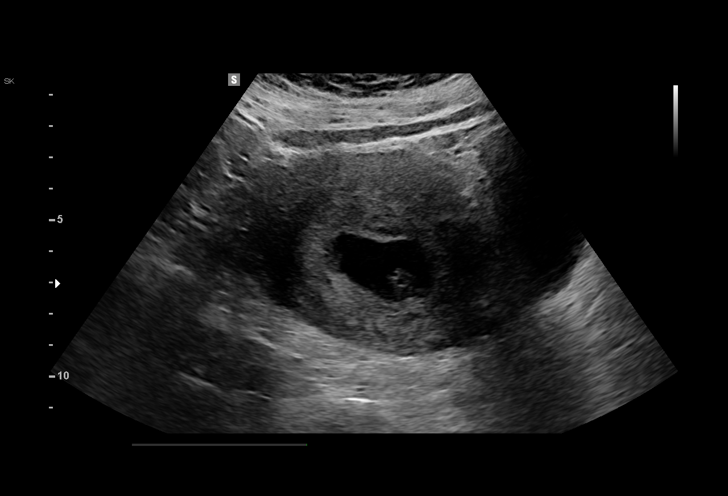
[im 9/55]
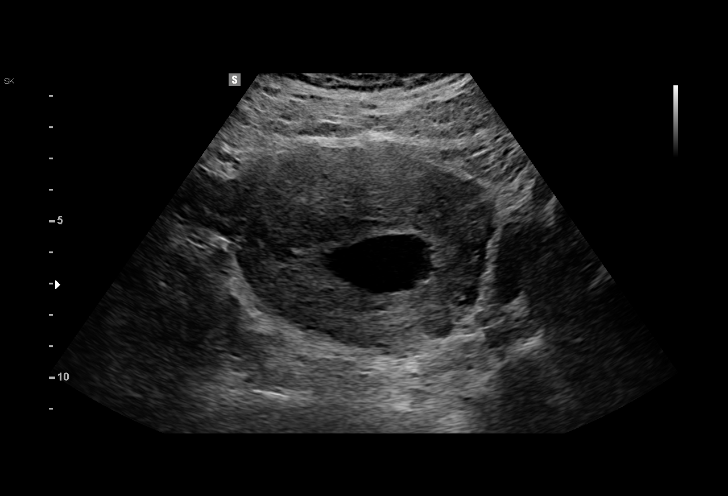
[im 13/55]
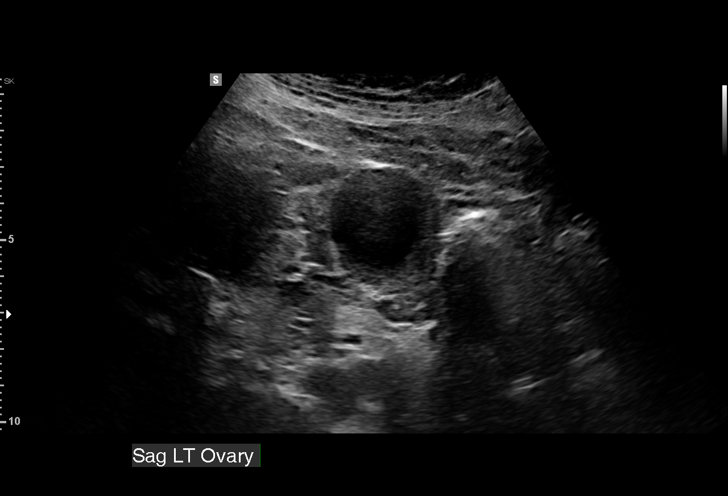
[im 17/55]
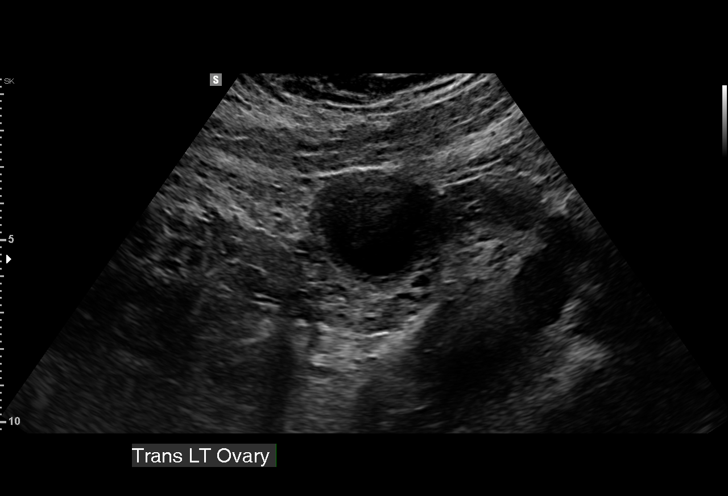
[im 21/55]
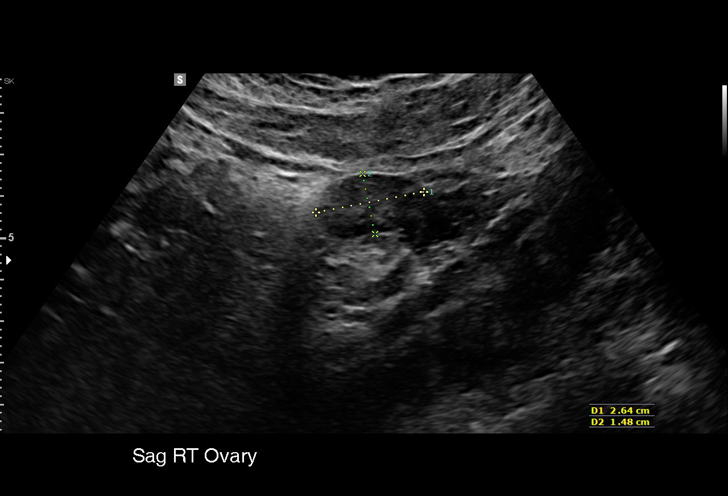
[im 25/55]
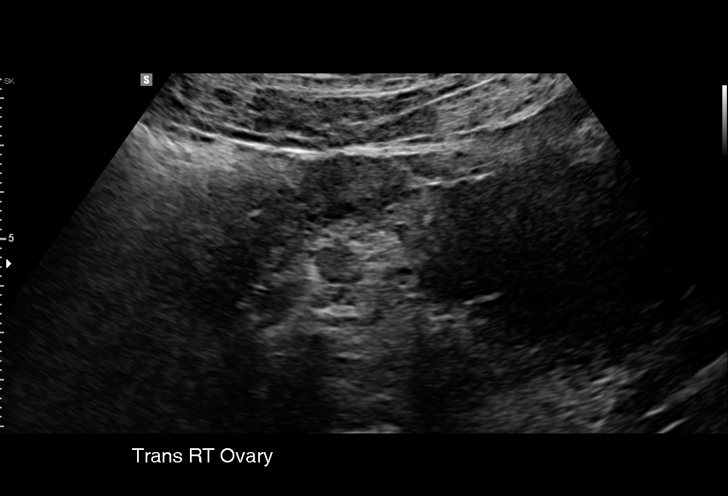
[im 29/55]
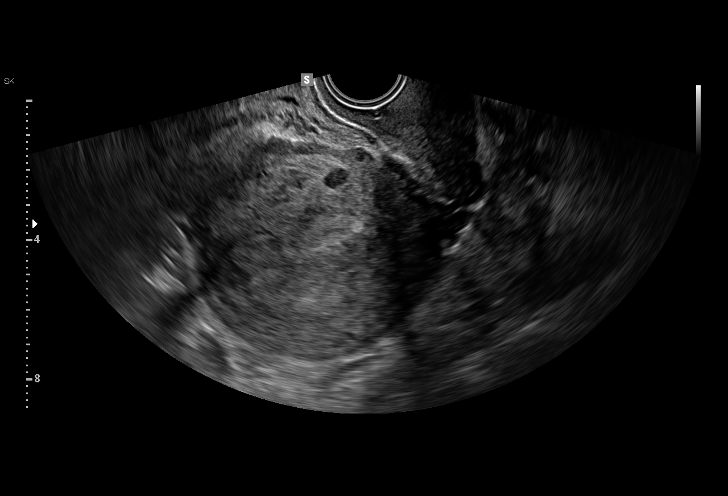
[im 31/55]
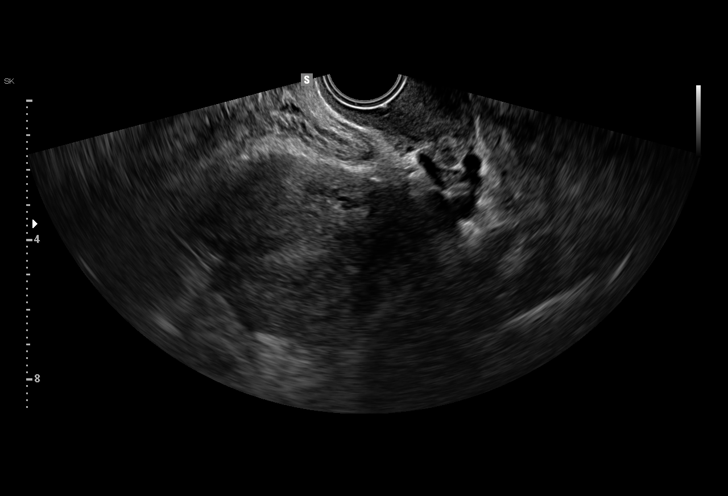
[im 35/55]
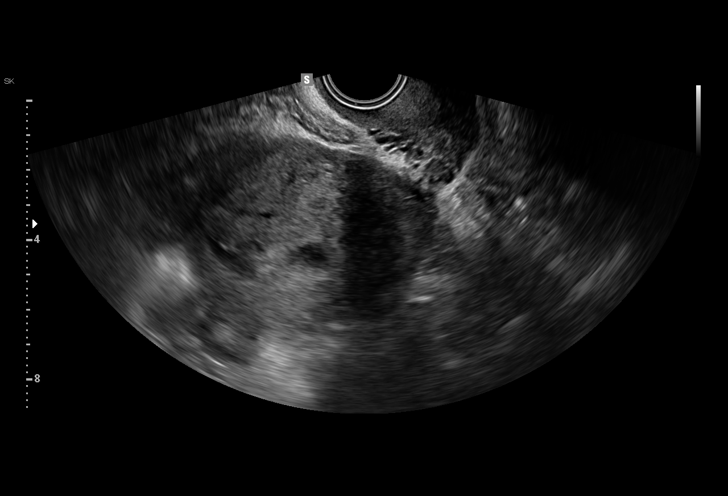
[im 39/55]
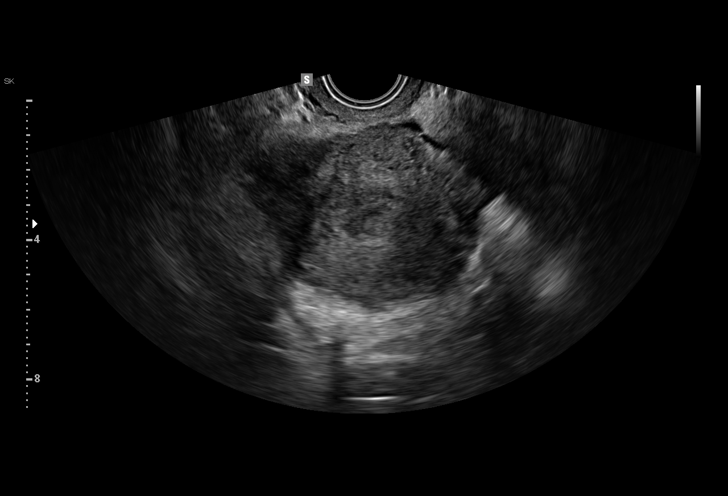
[im 43/55]
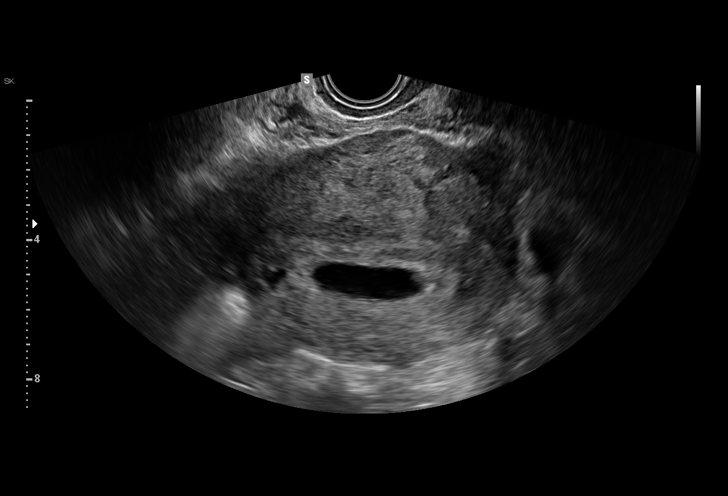
[im 47/55]
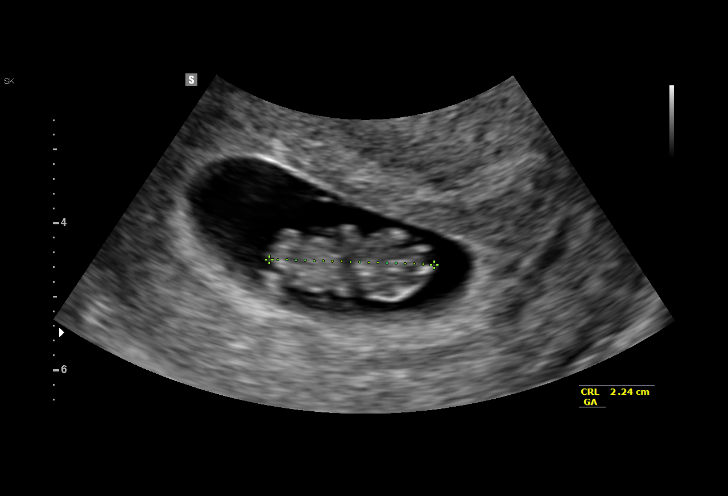
[im 51/55]
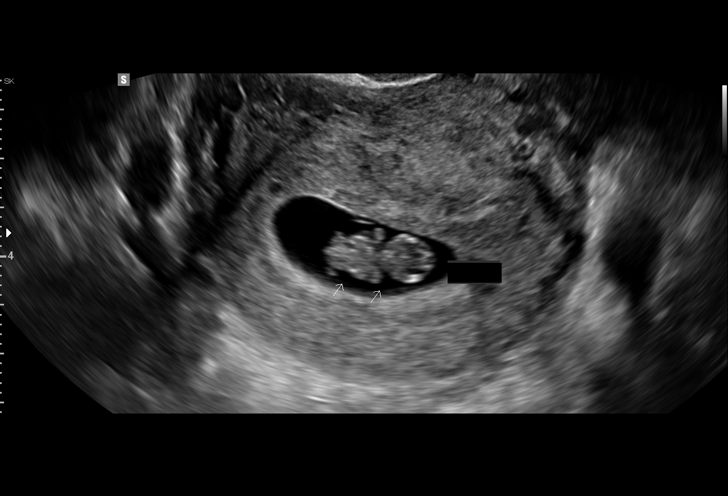
[im 55/55]
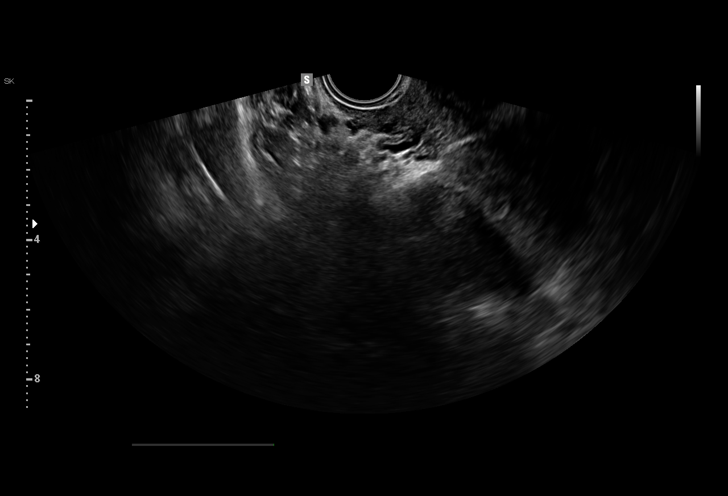

[15 of 28 positions shown; findings below may reference images not displayed]

FINDINGS: Intrauterine gestational sac: Yes

Yolk sac:  Yes

Embryo:  Yes

Cardiac Activity: Yes

Heart Rate: 169  bpm

CRL:  22.4  mm   8 w   6 d                  US EDC: 11/07/2016

Subchorionic hemorrhage:  None visualized.

Maternal uterus/adnexae: 2 small, sub cm, peripheral, mural uterine
masses consistent with fibroids. Largest measures 7 mm. No other
uterine abnormality. No adnexal masses. No free fluid. 2.8 cm
probable left ovarian corpus luteum.
IMPRESSION: 1. Single live intrauterine pregnancy with a measured gestational
age of 8 weeks and 6 days. No sonographic evidence of a pregnancy
complication. No emergent maternal abnormality.
2. 2 subcentimeter mural fibroids.

## 2018-05-23 ENCOUNTER — Other Ambulatory Visit (HOSPITAL_COMMUNITY)
Admission: RE | Admit: 2018-05-23 | Discharge: 2018-05-23 | Disposition: A | Discharge status: Home / Self Care | Payer: Managed Care, Other (non HMO) | Source: Ambulatory Visit | Attending: Obstetrics and Gynecology | Admitting: Obstetrics and Gynecology

## 2018-05-23 ENCOUNTER — Ambulatory Visit (INDEPENDENT_AMBULATORY_CARE_PROVIDER_SITE_OTHER): Payer: Managed Care, Other (non HMO) | Admitting: Obstetrics and Gynecology

## 2018-05-23 ENCOUNTER — Encounter: Payer: Self-pay | Admitting: Obstetrics and Gynecology

## 2018-05-23 DIAGNOSIS — Z23 Encounter for immunization: Secondary | ICD-10-CM | POA: Diagnosis not present

## 2018-05-23 DIAGNOSIS — R87810 Cervical high risk human papillomavirus (HPV) DNA test positive: Secondary | ICD-10-CM | POA: Diagnosis not present

## 2018-05-23 DIAGNOSIS — Z124 Encounter for screening for malignant neoplasm of cervix: Secondary | ICD-10-CM | POA: Insufficient documentation

## 2018-05-23 DIAGNOSIS — Z01419 Encounter for gynecological examination (general) (routine) without abnormal findings: Secondary | ICD-10-CM | POA: Diagnosis not present

## 2018-05-23 NOTE — Patient Instructions (Signed)
Tdap Vaccine (Tetanus, Diphtheria and Pertussis): What You Need to Know 1. Why get vaccinated? Tetanus, diphtheria and pertussis are very serious diseases. Tdap vaccine can protect us from these diseases. And, Tdap vaccine given to pregnant women can protect newborn babies against pertussis. TETANUS (Lockjaw) is rare in the [place] today. It causes painful muscle tightening and stiffness, usually all over the body.  It can lead to tightening of muscles in the head and neck so you can't open your mouth, swallow, or sometimes even breathe. Tetanus kills about 1 out of 10 people who are infected even after receiving the best medical care.  DIPHTHERIA is also rare in the [place] today. It can cause a thick coating to form in the back of the throat.  It can lead to breathing problems, heart failure, paralysis, and death.  PERTUSSIS (Whooping Cough) causes severe coughing spells, which can cause difficulty breathing, vomiting and disturbed sleep.  It can also lead to weight loss, incontinence, and rib fractures. Up to 2 in 100 adolescents and 5 in 100 adults with pertussis are hospitalized or have complications, which could include pneumonia or death.  These diseases are caused by bacteria. Diphtheria and pertussis are spread from person to person through secretions from coughing or sneezing. Tetanus enters the body through cuts, scratches, or wounds. Before vaccines, as many as 200,000 cases of diphtheria, 200,000 cases of pertussis, and hundreds of cases of tetanus, were reported in the [place] each year. Since vaccination began, reports of cases for tetanus and diphtheria have dropped by about 99% and for pertussis by about 80%. 2. Tdap vaccine Tdap vaccine can protect adolescents and adults from tetanus, diphtheria, and pertussis. One dose of Tdap is routinely given at age 11 or 12. People who did not get Tdap at that age should get it as soon as possible. Tdap is especially  important for healthcare professionals and anyone having close contact with a baby younger than 12 months. Pregnant women should get a dose of Tdap during every pregnancy, to protect the newborn from pertussis. Infants are most at risk for severe, life-threatening complications from pertussis. Another vaccine, called Td, protects against tetanus and diphtheria, but not pertussis. A Td booster should be given every 10 years. Tdap may be given as one of these boosters if you have never gotten Tdap before. Tdap may also be given after a severe cut or burn to prevent tetanus infection. Your doctor or the person giving you the vaccine can give you more information. Tdap may safely be given at the same time as other vaccines. 3. Some people should not get this vaccine  A person who has ever had a life-threatening allergic reaction after a previous dose of any diphtheria, tetanus or pertussis containing vaccine, OR has a severe allergy to any part of this vaccine, should not get Tdap vaccine. Tell the person giving the vaccine about any severe allergies.  Anyone who had coma or long repeated seizures within 7 days after a childhood dose of DTP or DTaP, or a previous dose of Tdap, should not get Tdap, unless a cause other than the vaccine was found. They can still get Td.  Talk to your doctor if you: ? have seizures or another nervous system problem, ? had severe pain or swelling after any vaccine containing diphtheria, tetanus or pertussis, ? ever had a condition called Guillain-Barr Syndrome (GBS), ? aren't feeling well on the day the shot is scheduled. 4. Risks With any medicine, including   vaccines, there is a chance of side effects. These are usually mild and go away on their own. Serious reactions are also possible but are rare. Most people who get Tdap vaccine do not have any problems with it. Mild problems following Tdap: (Did not interfere with activities)  Pain where the shot was given (about  3 in 4 adolescents or 2 in 3 adults)  Redness or swelling where the shot was given (about 1 person in 5)  Mild fever of at least 100.4F (up to about 1 in 25 adolescents or 1 in 100 adults)  Headache (about 3 or 4 people in 10)  Tiredness (about 1 person in 3 or 4)  Nausea, vomiting, diarrhea, stomach ache (up to 1 in 4 adolescents or 1 in 10 adults)  Chills, sore joints (about 1 person in 10)  Body aches (about 1 person in 3 or 4)  Rash, swollen glands (uncommon)  Moderate problems following Tdap: (Interfered with activities, but did not require medical attention)  Pain where the shot was given (up to 1 in 5 or 6)  Redness or swelling where the shot was given (up to about 1 in 16 adolescents or 1 in 12 adults)  Fever over 102F (about 1 in 100 adolescents or 1 in 250 adults)  Headache (about 1 in 7 adolescents or 1 in 10 adults)  Nausea, vomiting, diarrhea, stomach ache (up to 1 or 3 people in 100)  Swelling of the entire arm where the shot was given (up to about 1 in 500).  Severe problems following Tdap: (Unable to perform usual activities; required medical attention)  Swelling, severe pain, bleeding and redness in the arm where the shot was given (rare).  Problems that could happen after any vaccine:  People sometimes faint after a medical procedure, including vaccination. Sitting or lying down for about 15 minutes can help prevent fainting, and injuries caused by a fall. Tell your doctor if you feel dizzy, or have vision changes or ringing in the ears.  Some people get severe pain in the shoulder and have difficulty moving the arm where a shot was given. This happens very rarely.  Any medication can cause a severe allergic reaction. Such reactions from a vaccine are very rare, estimated at fewer than 1 in a million doses, and would happen within a few minutes to a few hours after the vaccination. As with any medicine, there is a very remote chance of a vaccine  causing a serious injury or death. The safety of vaccines is always being monitored. For more information, visit: www.cdc.gov/vaccinesafety/ 5. What if there is a serious problem? What should I look for? Look for anything that concerns you, such as signs of a severe allergic reaction, very high fever, or unusual behavior. Signs of a severe allergic reaction can include hives, swelling of the face and throat, difficulty breathing, a fast heartbeat, dizziness, and weakness. These would usually start a few minutes to a few hours after the vaccination. What should I do?  If you think it is a severe allergic reaction or other emergency that can't wait, call 9-1-1 or get the person to the nearest hospital. Otherwise, call your doctor.  Afterward, the reaction should be reported to the Vaccine Adverse Event Reporting System (VAERS). Your doctor might file this report, or you can do it yourself through the VAERS web site at www.vaers.hhs.gov, or by calling 1-800-822-7967. ? VAERS does not give medical advice. 6. The National Vaccine Injury Compensation Program The National   Vaccine Injury Compensation Program (VICP) is a federal program that was created to compensate people who may have been injured by certain vaccines. Persons who believe they may have been injured by a vaccine can learn about the program and about filing a claim by calling 1-800-338-2382 or visiting the VICP website at www.hrsa.gov/vaccinecompensation. There is a time limit to file a claim for compensation. 7. How can I learn more?  Ask your doctor. He or she can give you the vaccine package insert or suggest other sources of information.  Call your local or state health department.  Contact the Centers for Disease Control and Prevention (CDC): ? Call 1-800-232-4636 (1-800-CDC-INFO) or ? Visit CDC's website at www.cdc.gov/vaccines CDC Tdap Vaccine VIS (11/04/13) This information is not intended to replace advice given to you by your  health care provider. Make sure you discuss any questions you have with your health care provider. Document Released: 02/27/2012 Document Revised: 05/18/2016 Document Reviewed: 05/18/2016 Elsevier Interactive Patient Education  2017 [employer].  

## 2018-05-23 NOTE — Progress Notes (Signed)
Last PAP: 04/10/16 - Normal STD Screening - yes Influenza vaccine - declined Tdap vaccine - yes

## 2018-05-23 NOTE — Progress Notes (Signed)
GYNECOLOGY CLINIC ANNUAL PREVENTATIVE CARE ENCOUNTER NOTE  Subjective:   Maria Fuentes is a 32 y.o. [id number] female here for a routine annual gynecologic exam.  Current complaints: none.   Denies abnormal vaginal bleeding, discharge, pelvic pain, problems with intercourse or other gynecologic concerns.    Gynecologic History No LMP recorded. Contraception: tubal ligation Last Pap: 04/10/2016. Results were: normal Last mammogram: N/A. Results were: N/A  Obstetric History OB History  Gravida Para Term Preterm AB Living  3 2 2   1 2   SAB TAB Ectopic Multiple Live Births    1   0 2    # Outcome Date GA Lbr Len/2nd Weight Sex Delivery Anes PTL Lv  3 Term 11/02/16 6785w0d  8 lb 14.3 oz (4.035 kg) M CS-Vac Spinal  LIV  2 Term 08/16/12 739w1d   M CS-LTranv EPI  LIV  1 TAB             Past Medical History:  Diagnosis Date  . H/O varicella   . Sickle cell trait [facility](HCC)     Past Surgical History:  Procedure Laterality Date  . CESAREAN SECTION  08/16/2012   Procedure: CESAREAN SECTION;  Surgeon: Freddrick MarchKendra H. Tenny Crawoss, MD;  Location: [facility];  Service: Obstetrics;  Laterality: N/A;  . CESAREAN SECTION N/A 11/02/2016   Procedure: CESAREAN SECTION;  Surgeon: Marlow Baarslark, Dyanna, MD;  [facility];  Service: Obstetrics;  Laterality: N/A;  . DILATION AND CURETTAGE OF UTERUS    . THERAPEUTIC ABORTION    . TUBAL LIGATION N/A 11/02/2016   Procedure: BILATERAL TUBAL LIGATION;  Surgeon: Marlow Baarslark, Dyanna, MD;  [facility];  Service: Obstetrics;  Laterality: N/A;  . WISDOM TOOTH EXTRACTION      Current Outpatient Medications on File Prior to Visit  Medication Sig Dispense Refill  . ibuprofen (ADVIL,MOTRIN) 600 MG tablet Take 1 tablet (600 mg total) by mouth every 6 (six) hours. 30 tablet 0   No current facility-administered medications on file prior to visit.     No Known Allergies  Social History   Socioeconomic History  . Marital status: Single    Spouse name: Not on file   . Number of children: Not on file  . Years of education: Not on file  . Highest education level: Not on file  Occupational History  . Not on file  Social Needs  . Financial resource strain: Not on file  . Food insecurity:    Worry: Not on file    Inability: Not on file  . Transportation needs:    Medical: Not on file    Non-medical: Not on file  Tobacco Use  . Smoking status: Never Smoker  . Smokeless tobacco: Never Used  Substance and Sexual Activity  . Alcohol use: Yes    Comment: socially  . Drug use: No  . Sexual activity: Yes    Partners: Male    Birth control/protection: Surgical  Lifestyle  . Physical activity:    Days per week: Not on file    Minutes per session: Not on file  . Stress: Not on file  Relationships  . Social connections:    Talks on phone: Not on file    Gets together: Not on file    Attends religious service: Not on file    Active member of club or organization: Not on file    Attends meetings of clubs or organizations: Not on file    Relationship status: Not on file  . Intimate partner  violence:    Fear of current or ex partner: Not on file    Emotionally abused: Not on file    Physically abused: Not on file    Forced sexual activity: Not on file  Other Topics Concern  . Not on file  Social History Narrative  . Not on file    Family History  Problem Relation Age of Onset  . Diabetes Maternal Grandmother   . Hypertension Maternal Grandmother   . Sickle cell trait Mother     The following portions of the patient's history were reviewed and updated as appropriate: allergies, current medications, past family history, past medical history, past social history, past surgical history and problem list.  Review of Systems Constitutional: negative Eyes: negative Ears, nose, mouth, throat, and face: negative Respiratory: negative Cardiovascular: negative Gastrointestinal: negative Genitourinary:negative Integument/breast:  negative Hematologic/lymphatic: negative Musculoskeletal:negative Neurological: negative Behavioral/Psych: negative Endocrine: negative Allergic/Immunologic: negative   Objective:  BP 108/73 (BP Location: Right Arm, Patient Position: Sitting, Cuff Size: Large)   Pulse (!) 101   Ht 5' 5.5" (1.664 m)   Wt 245 lb 12.8 oz (111.5 kg)   SpO2 (!) 18%   BMI 40.28 kg/m  CONSTITUTIONAL: Well-developed, well-nourished female in no acute distress.  HENT:  Normocephalic, atraumatic, External right and left ear normal. Oropharynx is clear and moist EYES: Conjunctivae and EOM are normal. Pupils are equal, round, and reactive to light. No scleral icterus.  NECK: Normal range of motion, supple, no masses.  Normal thyroid.  SKIN: Skin is warm and dry. No rash noted. Not diaphoretic. No erythema. No pallor. NEUROLGIC: Alert and oriented to person, place, and time. Normal reflexes, muscle tone coordination. No cranial nerve deficit noted. PSYCHIATRIC: Normal mood and affect. Normal behavior. Normal judgment and thought content. CARDIOVASCULAR: Normal heart rate noted, regular rhythm RESPIRATORY: Clear to auscultation bilaterally. Effort and breath sounds normal, no problems with respiration noted. BREASTS: Symmetric in size. No masses, skin changes, nipple drainage, or lymphadenopathy. ABDOMEN: Soft, normal bowel sounds, no distention noted.  No tenderness, rebound or guarding.  PELVIC: Normal appearing external genitalia; normal appearing vaginal mucosa and cervix.  No abnormal discharge noted.  Pap smear obtained.  Normal uterine size, no other palpable masses, no uterine or adnexal tenderness. MUSCULOSKELETAL: Normal range of motion. No tenderness.  No cyanosis, clubbing, or edema.  2+ distal pulses.   Assessment:  Annual gynecologic examination with pap smear   Plan:   Will follow up results of pap smear and manage accordingly. Routine preventative health maintenance measures emphasized. Flu  vaccine declined today. TdaP vaccine given today. Please refer to After Visit Summary for other counseling recommendations.   Raelyn Mora, CNM  05/23/2018 2:54 PM

## 2018-05-27 LAB — CYTOLOGY - PAP
Candida vaginitis: NEGATIVE
Chlamydia: NEGATIVE
DIAGNOSIS: NEGATIVE
HPV (WINDOPATH): DETECTED — AB
NEISSERIA GONORRHEA: NEGATIVE
TRICH (WINDOWPATH): NEGATIVE

## 2018-06-25 IMAGING — US US MFM OB FOLLOW-UP
1 series · 14 of 28 positions shown · non-contrast
Comparison: none

[Series 1: us mfm ob follow-up · 14 of 48 slices shown]
[im 2/48]
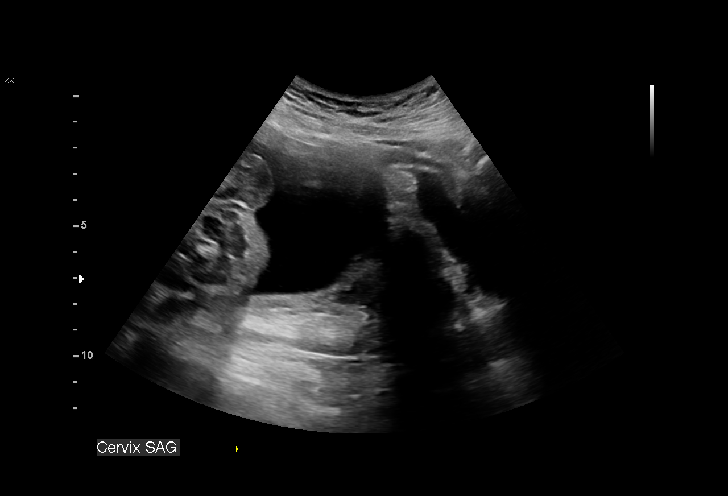
[im 6/48]
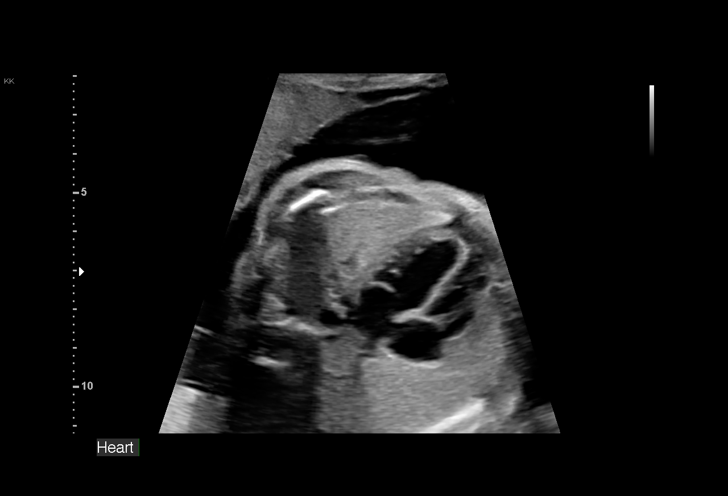
[im 9/48]
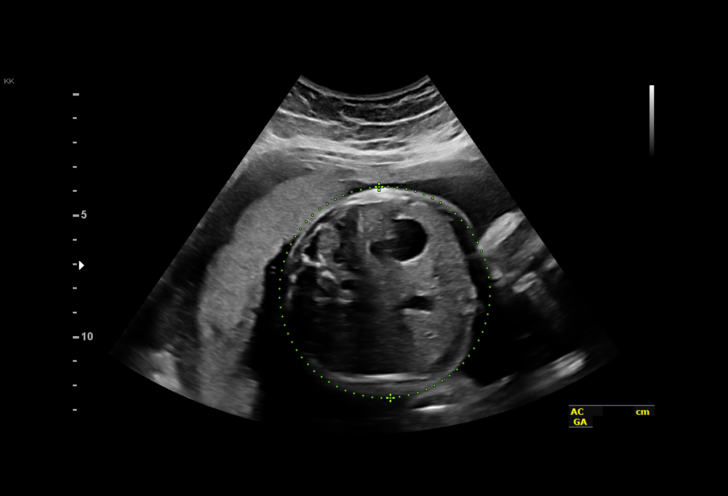
[im 13/48]
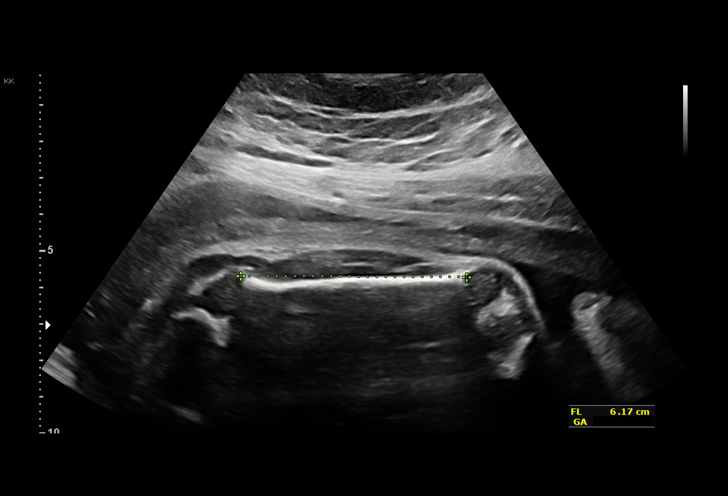
[im 16/48]
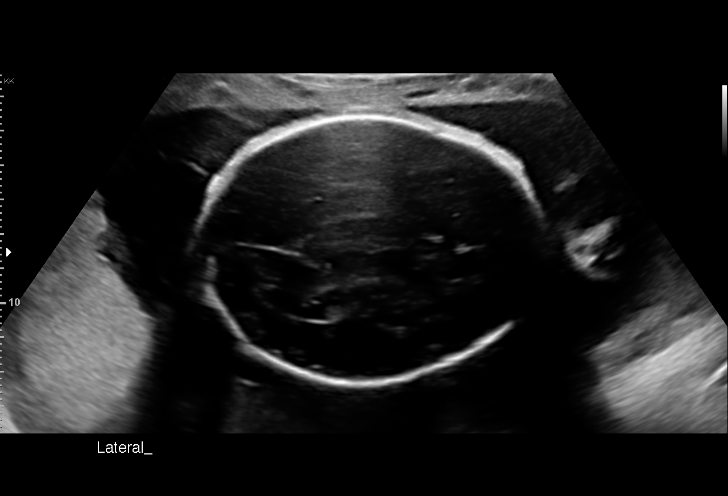
[im 20/48]
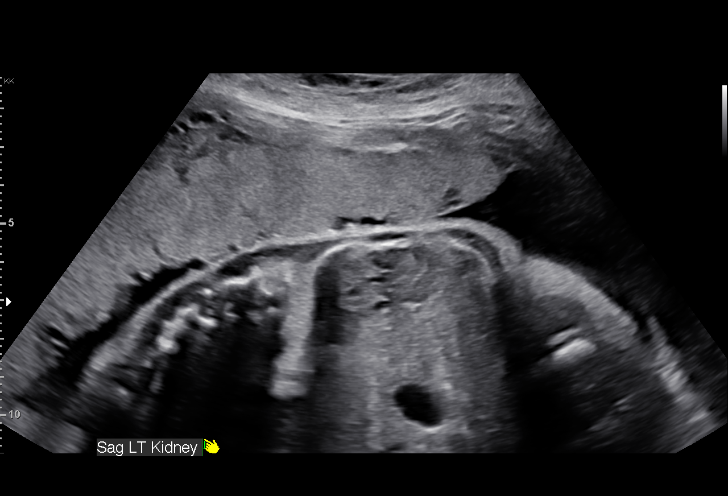
[im 23/48]
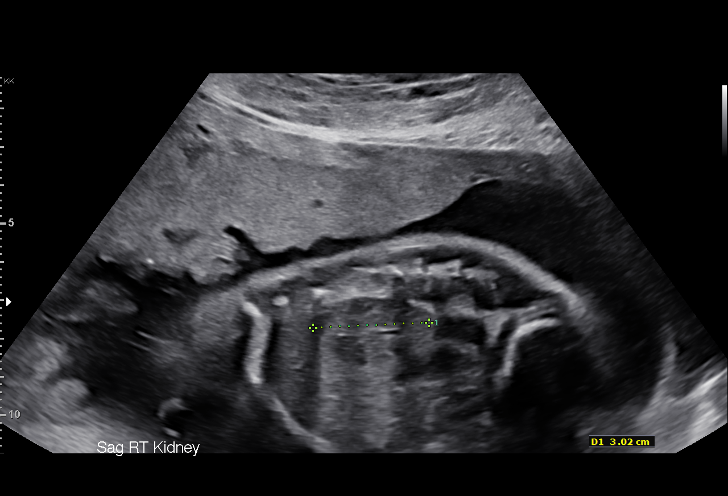
[im 27/48]
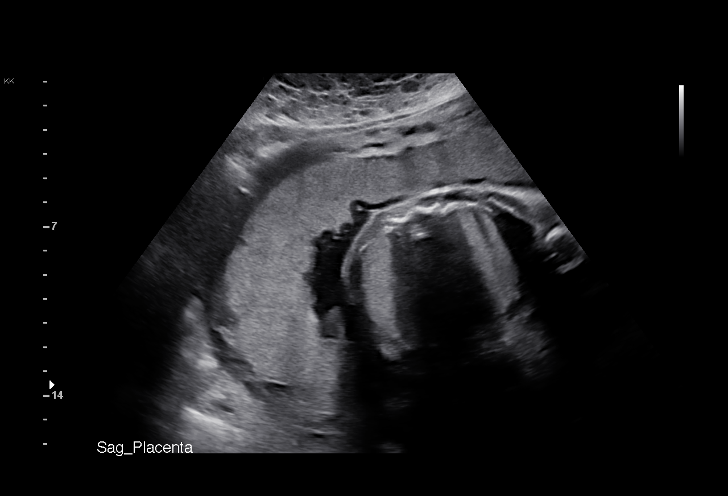
[im 30/48]
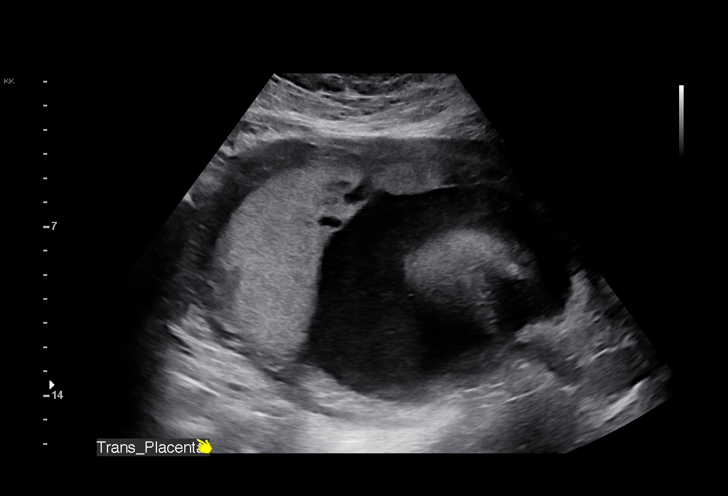
[im 34/48]
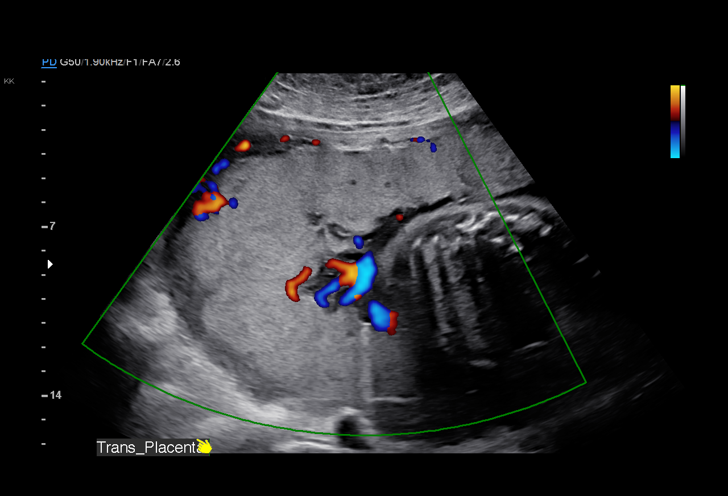
[im 37/48]
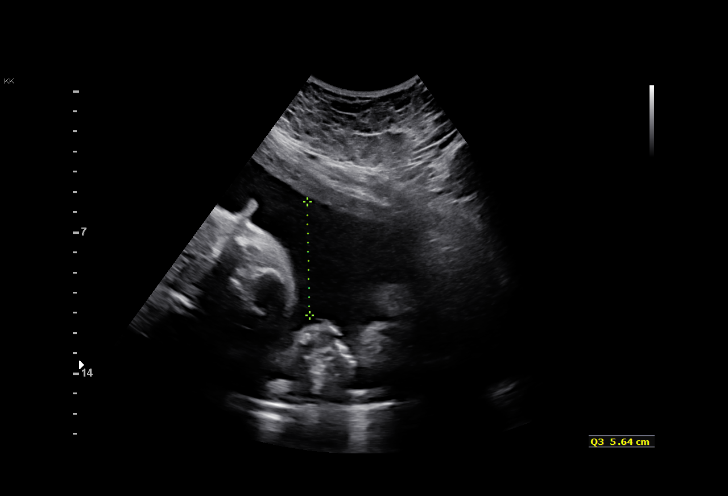
[im 41/48]
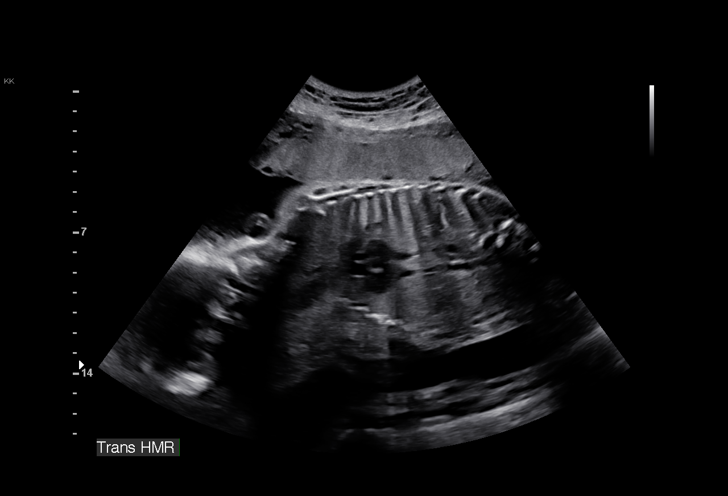
[im 44/48]
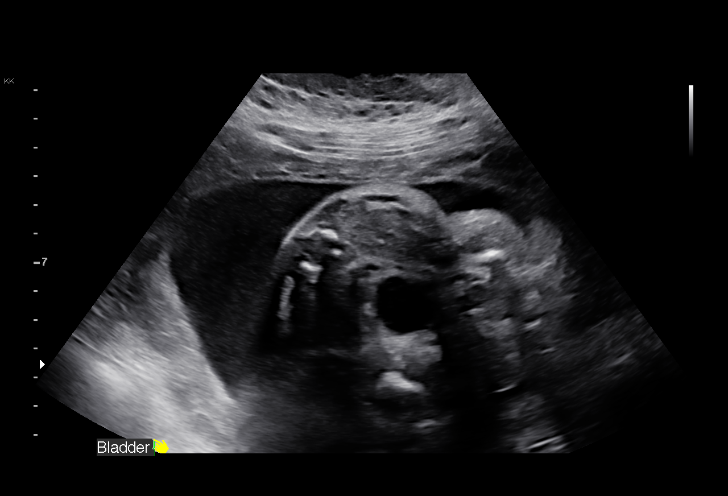
[im 48/48]
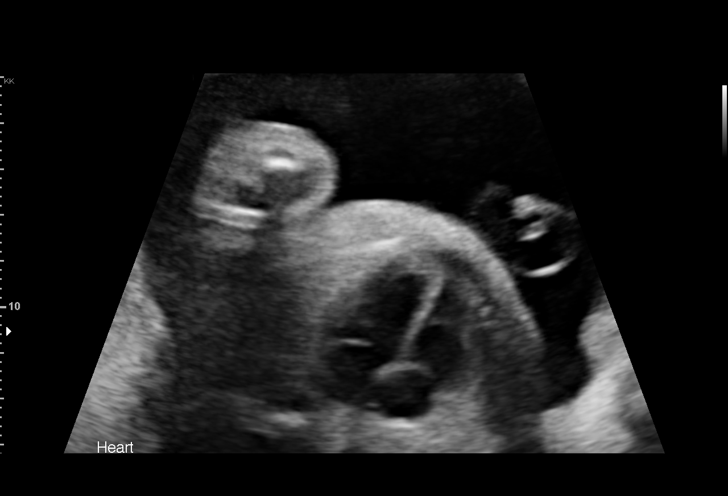

[14 of 28 positions shown; findings below may reference images not displayed]

RUSK DO

1  YUNIR MENZUA              [id number]      [id number]     [id number]
Indications

29 weeks gestation of pregnancy
History of sickle cell trait
Previous cesarean delivery, antepartum
Obesity complicating pregnancy, third
trimester
OB History

Blood Type:            Height:  5'2"   Weight (lb):  253       BMI:
Gravidity:    3         Term:   1        Prem:   0        SAB:   0
TOP:          1       Ectopic:  0        Living: 1
Fetal Evaluation

Num Of Fetuses:     1
Fetal Heart         145
Rate(bpm):
Cardiac Activity:   Observed
Presentation:       Variable
Placenta:           Posterior, above cervical os

Amniotic Fluid
AFI FV:      Subjectively within normal limits

AFI Sum(cm)     %Tile       Largest Pocket(cm)
20.88           82

RUQ(cm)       RLQ(cm)       LUQ(cm)        LLQ(cm)
6.69
Biometry
BPD:      75.5  mm     G. Age:  30w 2d         52  %    CI:        71.46   %    70 - 86
FL/HC:      21.3   %    19.2 -
HC:      284.4  mm     G. Age:  31w 2d         55  %    HC/AC:      1.03        0.99 -
AC:       277   mm     G. Age:  31w 6d         91  %    FL/BPD:     80.3   %    71 - 87
FL:       60.6  mm     G. Age:  31w 4d         79  %    FL/AC:      21.9   %    20 - 24

Est. FW:    5669  gm    3 lb 15 oz      79  %
Gestational Age

LMP:           29w 2d        Date:  02/05/16                 EDD:   11/11/16
U/S Today:     31w 2d                                        EDD:   10/28/16
Best:          29w 6d     Det. By:  Early Ultrasound         EDD:   11/07/16
(04/03/16)
Anatomy

Cranium:               Appears normal         Aortic Arch:            Previously seen
Cavum:                 Previously seen        Ductal Arch:            Previously seen
Ventricles:            Appears normal         Diaphragm:              Appears normal
Choroid Plexus:        Previously seen        Stomach:                Appears normal, left
sided
Cerebellum:            Previously seen        Abdomen:                Appears normal
Posterior Fossa:       Appears normal         Abdominal Wall:         Previously seen
Nuchal Fold:           Not applicable (>20    Cord Vessels:           Previously seen
wks GA)
Face:                  Orbits and profile     Kidneys:                Appear normal
previously seen
Lips:                  Previously seen        Bladder:                Appears normal
Thoracic:              Previously seen        Spine:                  Previously seen
Heart:                 Appears normal         Upper Extremities:      Previously seen
(4CH, axis, and
situs)
RVOT:                  Previously seen        Lower Extremities:      Previously seen
LVOT:                  Appears normal

Other:  Fetus appears to be a male. Heels, 5th digit, and Nasal bone
previously visualized.
Cervix Uterus Adnexa

Cervix
Not visualized (advanced GA >57wks)

Adnexa:       Not Visualized
Impression

Single IUP at 29w 6d
Normal interval anatomy
The estimated fetal weight today is at the 79th %tile.  The AC
measures at the 91st %tile.
The previously noted "bridging vessels" at the placental cord
insertion were unable to be reproduced on today's study
Posterior placenta without previa
Normal amniotic fluid volume (AFI 20.9 cm)
Recommendations

Recommend ultrasound for growth in 4 weeks - please
contact our office if you would prefer that this study be
scheduled with MFM
